# Patient Record
Sex: Female | Born: 1955
Health system: Southern US, Community
[De-identification: ages and names within clinical notes are randomized; demographics above are authoritative.]

## PROBLEM LIST (undated history)

## (undated) ENCOUNTER — Ambulatory Visit: Admission: EM | Payer: Medicare Other

## (undated) DIAGNOSIS — D582 Other hemoglobinopathies: Secondary | ICD-10-CM

## (undated) DIAGNOSIS — E785 Hyperlipidemia, unspecified: Secondary | ICD-10-CM

## (undated) DIAGNOSIS — D649 Anemia, unspecified: Secondary | ICD-10-CM

## (undated) HISTORY — DX: Anemia, unspecified: D64.9

## (undated) HISTORY — DX: Hyperlipidemia, unspecified: E78.5

## (undated) HISTORY — PX: WISDOM TOOTH EXTRACTION: SHX21

---

## 1998-02-14 ENCOUNTER — Other Ambulatory Visit: Admission: RE | Admit: 1998-02-14 | Discharge: 1998-02-14 | Payer: Self-pay | Admitting: Obstetrics and Gynecology

## 1999-01-16 ENCOUNTER — Other Ambulatory Visit: Admission: RE | Admit: 1999-01-16 | Discharge: 1999-01-16 | Payer: Self-pay | Admitting: Obstetrics and Gynecology

## 1999-02-01 ENCOUNTER — Ambulatory Visit (HOSPITAL_COMMUNITY): Admission: RE | Admit: 1999-02-01 | Discharge: 1999-02-01 | Payer: Self-pay | Admitting: Obstetrics and Gynecology

## 1999-02-01 ENCOUNTER — Encounter: Payer: Self-pay | Admitting: Obstetrics and Gynecology

## 2000-01-22 ENCOUNTER — Other Ambulatory Visit: Admission: RE | Admit: 2000-01-22 | Discharge: 2000-01-22 | Payer: Self-pay | Admitting: Obstetrics and Gynecology

## 2001-02-17 ENCOUNTER — Other Ambulatory Visit: Admission: RE | Admit: 2001-02-17 | Discharge: 2001-02-17 | Payer: Self-pay | Admitting: Obstetrics and Gynecology

## 2002-03-11 ENCOUNTER — Other Ambulatory Visit: Admission: RE | Admit: 2002-03-11 | Discharge: 2002-03-11 | Payer: Self-pay | Admitting: Obstetrics and Gynecology

## 2003-02-10 ENCOUNTER — Other Ambulatory Visit: Admission: RE | Admit: 2003-02-10 | Discharge: 2003-02-10 | Payer: Self-pay | Admitting: Obstetrics and Gynecology

## 2003-03-04 ENCOUNTER — Ambulatory Visit (HOSPITAL_COMMUNITY): Admission: RE | Admit: 2003-03-04 | Discharge: 2003-03-04 | Payer: Self-pay | Admitting: Obstetrics and Gynecology

## 2004-02-07 ENCOUNTER — Other Ambulatory Visit: Admission: RE | Admit: 2004-02-07 | Discharge: 2004-02-07 | Payer: Self-pay | Admitting: Obstetrics and Gynecology

## 2005-01-04 ENCOUNTER — Other Ambulatory Visit: Admission: RE | Admit: 2005-01-04 | Discharge: 2005-01-04 | Payer: Self-pay | Admitting: Obstetrics and Gynecology

## 2012-03-04 ENCOUNTER — Other Ambulatory Visit: Payer: Self-pay | Admitting: Family Medicine

## 2012-03-04 ENCOUNTER — Other Ambulatory Visit (HOSPITAL_COMMUNITY)
Admission: RE | Admit: 2012-03-04 | Discharge: 2012-03-04 | Disposition: A | Discharge status: Home / Self Care | Payer: BC Managed Care – PPO | Source: Ambulatory Visit | Attending: Family Medicine | Admitting: Family Medicine

## 2012-03-04 DIAGNOSIS — Z0000 Encounter for general adult medical examination without abnormal findings: Secondary | ICD-10-CM | POA: Insufficient documentation

## 2012-08-21 ENCOUNTER — Ambulatory Visit (INDEPENDENT_AMBULATORY_CARE_PROVIDER_SITE_OTHER): Payer: BC Managed Care – PPO | Admitting: Family Medicine

## 2012-08-21 VITALS — BP 129/76 | HR 80 | Temp 98.5°F | Resp 18 | Wt 202.0 lb

## 2012-08-21 DIAGNOSIS — K0889 Other specified disorders of teeth and supporting structures: Secondary | ICD-10-CM

## 2012-08-21 DIAGNOSIS — K089 Disorder of teeth and supporting structures, unspecified: Secondary | ICD-10-CM

## 2012-08-21 DIAGNOSIS — K029 Dental caries, unspecified: Secondary | ICD-10-CM

## 2012-08-21 MED ORDER — IBUPROFEN 600 MG PO TABS: 600.0000 mg | ORAL_TABLET | Freq: Three times a day (TID) | ORAL | Status: DC | PRN

## 2012-08-21 MED ORDER — AMOXICILLIN 500 MG PO CAPS: 500.0000 mg | ORAL_CAPSULE | Freq: Three times a day (TID) | ORAL | Status: DC

## 2012-08-21 MED ORDER — TRAMADOL HCL 50 MG PO TABS: 50.0000 mg | ORAL_TABLET | Freq: Four times a day (QID) | ORAL | Status: DC | PRN

## 2012-08-21 NOTE — Progress Notes (Signed)
Subjective:    Patient ID: Jeanette Bentley, female    DOB: July 31, 1955, 57 y.o.   MRN: [id number]  HPI Jeanette Bentley is a 57 y.o. female R upper tooth - sensitive to hot/cold past few months, then ache past week. Has stopped chewing on that side. Called dentist today- closed.  No fever, no face or apparent gum swelling.    Tx: anbesol, tylenol - min relief.  No recent narcotic pain meds prescribed, NKI allergies.   Review of Systems  Constitutional: Negative for fever and chills.  HENT: Positive for dental problem.        Objective:   Physical Exam  Vitals reviewed. Constitutional: She is oriented to person, place, and time. She appears well-developed and well-nourished. No distress.  HENT:  Head: Atraumatic. Macrocephalic.  Right Ear: Hearing, tympanic membrane, external ear and ear canal normal.  Left Ear: Hearing, tympanic membrane, external ear and ear canal normal.  Nose: Nose normal.  Mouth/Throat: Uvula is midline and oropharynx is clear and moist. Dental caries (? small area of central decay. no apparent mvmt. ) present. No dental abscesses. No oropharyngeal exudate.    Eyes: Conjunctivae and EOM are normal. Pupils are equal, round, and reactive to light.  Cardiovascular: Normal rate, regular rhythm, normal heart sounds and intact distal pulses.   No murmur heard. Pulmonary/Chest: Effort normal and breath sounds normal. No respiratory distress. She has no wheezes. She has no rhonchi.  Neurological: She is alert and oriented to person, place, and time.  Skin: Skin is warm and dry. No rash noted.  Psychiatric: She has a normal mood and affect. Her behavior is normal.       Assessment & Plan:  Jeanette Bentley is a 57 y.o. female Tooth pain - Plan: ibuprofen (ADVIL,MOTRIN) 600 MG tablet, traMADol (ULTRAM) 50 MG tablet  Dental caries - Plan: amoxicillin (AMOXIL) 500 MG capsule  R upper 2nd molar - likely decay without sign of abscess. Sx care and call  dentist on Monday.  Ibuprofen or if needed ultram Rx. Amoxicillin if any gum or face swelling. Rtc/er precautions.   Meds ordered this encounter  Medications  . rosuvastatin (CRESTOR) 10 MG tablet    Sig: Take 10 mg by mouth daily.  Marland Kitchen aspirin 81 MG tablet    Sig: Take 81 mg by mouth daily.  Marland Kitchen amoxicillin (AMOXIL) 500 MG capsule    Sig: Take 1 capsule (500 mg total) by mouth 3 (three) times daily.    Dispense:  30 capsule    Refill:  0  . ibuprofen (ADVIL,MOTRIN) 600 MG tablet    Sig: Take 1 tablet (600 mg total) by mouth every 8 (eight) hours as needed for pain.    Dispense:  30 tablet    Refill:  0  . traMADol (ULTRAM) 50 MG tablet    Sig: Take 1 tablet (50 mg total) by mouth every 6 (six) hours as needed for pain.    Dispense:  10 tablet    Refill:  0  .   Patient Instructions  Ibuprofen as prescribed with food. If needed for more sever pain - can take tramadol.  If any swelling around tooth or in face, start antibiotic. Call your dentist to be seen the first part of next week.  Return to the clinic or go to the nearest emergency room if any of your symptoms worsen or new symptoms occur. Dental Pain A tooth ache may be caused by cavities (tooth decay). Cavities expose the nerve of the  tooth to air and hot or cold temperatures. It may come from an infection or abscess (also called a boil or furuncle) around your tooth. It is also often caused by dental caries (tooth decay). This causes the pain you are having. DIAGNOSIS  Your caregiver can diagnose this problem by exam. TREATMENT   If caused by an infection, it may be treated with medications which kill germs (antibiotics) and pain medications as prescribed by your caregiver. Take medications as directed.  Only take over-the-counter or prescription medicines for pain, discomfort, or fever as directed by your caregiver.  Whether the tooth ache today is caused by infection or dental disease, you should see your dentist as soon as  possible for further care. SEEK MEDICAL CARE IF: The exam and treatment you received today has been provided on an emergency basis only. This is not a substitute for complete medical or dental care. If your problem worsens or new problems (symptoms) appear, and you are unable to meet with your dentist, call or return to this location. SEEK IMMEDIATE MEDICAL CARE IF:   You have a fever.  You develop redness and swelling of your face, jaw, or neck.  You are unable to open your mouth.  You have severe pain uncontrolled by pain medicine. MAKE SURE YOU:   Understand these instructions.  Will watch your condition.  Will get help right away if you are not doing well or get worse. Document Released: 04/15/2005 Document Revised: 07/08/2011 Document Reviewed: 12/02/2007 [facility] Patient Information 2013 [place], [place].

## 2012-08-21 NOTE — Patient Instructions (Signed)
Ibuprofen as prescribed with food. If needed for more sever pain - can take tramadol.  If any swelling around tooth or in face, start antibiotic. Call your dentist to be seen the first part of next week.  Return to the clinic or go to the nearest emergency room if any of your symptoms worsen or new symptoms occur. Dental Pain A tooth ache may be caused by cavities (tooth decay). Cavities expose the nerve of the tooth to air and hot or cold temperatures. It may come from an infection or abscess (also called a boil or furuncle) around your tooth. It is also often caused by dental caries (tooth decay). This causes the pain you are having. DIAGNOSIS  Your caregiver can diagnose this problem by exam. TREATMENT   If caused by an infection, it may be treated with medications which kill germs (antibiotics) and pain medications as prescribed by your caregiver. Take medications as directed.  Only take over-the-counter or prescription medicines for pain, discomfort, or fever as directed by your caregiver.  Whether the tooth ache today is caused by infection or dental disease, you should see your dentist as soon as possible for further care. SEEK MEDICAL CARE IF: The exam and treatment you received today has been provided on an emergency basis only. This is not a substitute for complete medical or dental care. If your problem worsens or new problems (symptoms) appear, and you are unable to meet with your dentist, call or return to this location. SEEK IMMEDIATE MEDICAL CARE IF:   You have a fever.  You develop redness and swelling of your face, jaw, or neck.  You are unable to open your mouth.  You have severe pain uncontrolled by pain medicine. MAKE SURE YOU:   Understand these instructions.  Will watch your condition.  Will get help right away if you are not doing well or get worse. Document Released: 04/15/2005 Document Revised: 07/08/2011 Document Reviewed: 12/02/2007 [facility] [place] Patient  Information 2013 [place], [place].

## 2012-09-15 ENCOUNTER — Ambulatory Visit: Payer: BC Managed Care – PPO

## 2012-09-15 ENCOUNTER — Ambulatory Visit (INDEPENDENT_AMBULATORY_CARE_PROVIDER_SITE_OTHER): Payer: BC Managed Care – PPO | Admitting: Family Medicine

## 2012-09-15 VITALS — BP 122/74 | HR 68 | Temp 98.3°F | Resp 16 | Ht 64.5 in | Wt 201.0 lb

## 2012-09-15 DIAGNOSIS — M542 Cervicalgia: Secondary | ICD-10-CM

## 2012-09-15 MED ORDER — MELOXICAM 15 MG PO TABS: 15.0000 mg | ORAL_TABLET | Freq: Every day | ORAL | Status: DC | PRN

## 2012-09-15 MED ORDER — CYCLOBENZAPRINE HCL 10 MG PO TABS: 10.0000 mg | ORAL_TABLET | Freq: Three times a day (TID) | ORAL | Status: DC | PRN

## 2012-09-15 NOTE — Progress Notes (Addendum)
Jeanette Bentley is a 57 y.o. female who presents to [facility] today for left shoulder and neck pain for the last week without injury. Patient notes pain in her left trapezius and left thoracic back. She denies any pain that is worse with activity. She denies any pain radiating to her hand weakness or numbness. She feels well otherwise. She notes that she has been sleeping poorly recently and wonders if this is the cause of her pain. She works mostly a Office manager.  She has not tried any medications yet and feels well otherwise. She denies any similar episodes of neck or shoulder pain.  No pain with overhand motion.   PMH: Reviewed hyperlipidemia History  Substance Use Topics  . Smoking status: Never Smoker   . Smokeless tobacco: Not on file  . Alcohol Use: Yes   ROS as above  Medications reviewed. Current Outpatient Prescriptions  Medication Sig Dispense Refill  . aspirin 81 MG tablet Take 81 mg by mouth daily.      . rosuvastatin (CRESTOR) 10 MG tablet Take 10 mg by mouth daily.      Marland Kitchen amoxicillin (AMOXIL) 500 MG capsule Take 1 capsule (500 mg total) by mouth 3 (three) times daily.  30 capsule  0  . ibuprofen (ADVIL,MOTRIN) 600 MG tablet Take 1 tablet (600 mg total) by mouth every 8 (eight) hours as needed for pain.  30 tablet  0  . traMADol (ULTRAM) 50 MG tablet Take 1 tablet (50 mg total) by mouth every 6 (six) hours as needed for pain.  10 tablet  0   No current facility-administered medications for this visit.     Exam:  BP 122/74  Pulse 68  Temp(Src) 98.3 F (36.8 C) (Oral)  Resp 16  Ht 5' 4.5" (1.638 m)  Wt 201 lb (91.173 kg)  BMI 33.98 kg/m2  SpO2 96% Gen: Well NAD NECK: Nontender spinal midline. Tender left cervical and thoracic paraspinal muscles. Decreased range of motion with pain with left lateral flexion and rotation. Mildly positive Spurling test.  Left shoulder: Normal-appearing nontender. Normal range of motion. Negative impingement testing. Strength testing normal  throughout.   2v c-spine prelim results. Spur and DDD C6-7  Assessment and Plan: 57 y.o. female with neck muscle spasm with mild DDD. Plan to treat with meloxicam, tramadol, Flexeril. Additionally use heat. Consider physical therapy if not improving. Discussed warning signs or symptoms. Please see discharge instructions. Patient expresses understanding.  I have reviewed and agree with documentation. Robert P. Merla Riches, M.D.

## 2012-09-15 NOTE — Patient Instructions (Addendum)
Thank you for coming in today. YOu have a neck muscle spasm as the cause of your pain.  Use the meloxicam as needed for pain.  Also try flexeril and tramadol.  Call for physical therapy in 1 week if not improving.  Come back or go to the emergency room if you notice new weakness new numbness problems walking or bowel or bladder problems.  Cervical Sprain A cervical sprain is an injury in the neck in which the ligaments are stretched or torn. The ligaments are the tissues that hold the bones of the neck (vertebrae) in place.Cervical sprains can range from very mild to very severe. Most cervical sprains get better in 1 to 3 weeks, but it depends on the cause and extent of the injury. Severe cervical sprains can cause the neck vertebrae to be unstable. This can lead to damage of the spinal cord and can result in serious nervous system problems. Your caregiver will determine whether your cervical sprain is mild or severe. CAUSES  Severe cervical sprains may be caused by:  Contact sport injuries (football, rugby, wrestling, hockey, auto racing, gymnastics, diving, martial arts, boxing).  Motor vehicle collisions.  Whiplash injuries. This means the neck is forcefully whipped backward and forward.  Falls. Mild cervical sprains may be caused by:   Awkward positions, such as cradling a telephone between your ear and shoulder.  Sitting in a chair that does not offer proper support.  Working at a poorly Marketing executive station.  Activities that require looking up or down for long periods of time. SYMPTOMS   Pain, soreness, stiffness, or a burning sensation in the front, back, or sides of the neck. This discomfort may develop immediately after injury or it may develop slowly and not begin for 24 hours or more after an injury.  Pain or tenderness directly in the middle of the back of the neck.  Shoulder or upper back pain.  Limited ability to move the  neck.  Headache.  Dizziness.  Weakness, numbness, or tingling in the hands or arms.  Muscle spasms.  Difficulty swallowing or chewing.  Tenderness and swelling of the neck. DIAGNOSIS  Most of the time, your caregiver can diagnose this problem by taking your history and doing a physical exam. Your caregiver will ask about any known problems, such as arthritis in the neck or a previous neck injury. X-rays may be taken to find out if there are any other problems, such as problems with the bones of the neck. However, an X-ray often does not reveal the full extent of a cervical sprain. Other tests such as a computed tomography (CT) scan or magnetic resonance imaging (MRI) may be needed. TREATMENT  Treatment depends on the severity of the cervical sprain. Mild sprains can be treated with rest, keeping the neck in place (immobilization), and pain medicines. Severe cervical sprains need immediate immobilization and an appointment with an orthopedist or neurosurgeon. Several treatment options are available to help with pain, muscle spasms, and other symptoms. Your caregiver may prescribe:  Medicines, such as pain relievers, numbing medicines, or muscle relaxants.  Physical therapy. This can include stretching exercises, strengthening exercises, and posture training. Exercises and improved posture can help stabilize the neck, strengthen muscles, and help stop symptoms from returning.  A neck collar to be worn for short periods of time. Often, these collars are worn for comfort. However, certain collars may be worn to protect the neck and prevent further worsening of a serious cervical sprain. HOME CARE INSTRUCTIONS  Put ice on the injured area.  Put ice in a plastic bag.  Place a towel between your skin and the bag.  Leave the ice on for 15 to 20 minutes, 3 to 4 times a day.  Only take over-the-counter or prescription medicines for pain, discomfort, or fever as directed by your  caregiver.  Keep all follow-up appointments as directed by your caregiver.  Keep all physical therapy appointments as directed by your caregiver.  If a neck collar is prescribed, wear it as directed by your caregiver.  Do not drive while wearing a neck collar.  Make any needed adjustments to your work station to promote good posture.  Avoid positions and activities that make your symptoms worse.  Warm up and stretch before being active to help prevent problems. SEEK MEDICAL CARE IF:   Your pain is not controlled with medicine.  You are unable to decrease your pain medicine over time as planned.  Your activity level is not improving as expected. SEEK IMMEDIATE MEDICAL CARE IF:   You develop any bleeding, stomach upset, or signs of an allergic reaction to your medicine.  Your symptoms get worse.  You develop new, unexplained symptoms.  You have numbness, tingling, weakness, or paralysis in any part of your body. MAKE SURE YOU:   Understand these instructions.  Will watch your condition.  Will get help right away if you are not doing well or get worse. Document Released: 02/10/2007 Document Revised: 07/08/2011 Document Reviewed: 01/16/2011 [facility] Patient Information 2013 [place], [place].

## 2012-12-08 ENCOUNTER — Other Ambulatory Visit (HOSPITAL_COMMUNITY): Payer: Self-pay | Admitting: Family Medicine

## 2012-12-08 DIAGNOSIS — Z1231 Encounter for screening mammogram for malignant neoplasm of breast: Secondary | ICD-10-CM

## 2012-12-09 ENCOUNTER — Ambulatory Visit (HOSPITAL_COMMUNITY)
Admission: RE | Admit: 2012-12-09 | Discharge: 2012-12-09 | Disposition: A | Discharge status: Home / Self Care | Payer: BC Managed Care – PPO | Source: Ambulatory Visit | Attending: Family Medicine | Admitting: Family Medicine

## 2012-12-09 DIAGNOSIS — Z1231 Encounter for screening mammogram for malignant neoplasm of breast: Secondary | ICD-10-CM | POA: Insufficient documentation

## 2013-10-13 ENCOUNTER — Ambulatory Visit (INDEPENDENT_AMBULATORY_CARE_PROVIDER_SITE_OTHER): Payer: BC Managed Care – PPO | Admitting: Family Medicine

## 2013-10-13 VITALS — BP 110/68 | HR 63 | Temp 98.2°F | Resp 16 | Ht 65.0 in | Wt 191.4 lb

## 2013-10-13 DIAGNOSIS — K319 Disease of stomach and duodenum, unspecified: Secondary | ICD-10-CM

## 2013-10-13 DIAGNOSIS — L723 Sebaceous cyst: Secondary | ICD-10-CM

## 2013-10-13 MED ORDER — DOXYCYCLINE HYCLATE 100 MG PO TABS: 100.0000 mg | ORAL_TABLET | Freq: Two times a day (BID) | ORAL | Status: DC

## 2013-10-13 NOTE — Progress Notes (Signed)
Chief Complaint:  Chief Complaint  Patient presents with  . Mass    small bump on lower L side, appeared a month ago    HPI: Jeanette Bentley is a 58 y.o. female who is here for  1 month history of irritated skin lesion on left side of her lower abdomen, 3-4 days ago started drainining she has been touchign it as well. . Deneis fevers or chills.  Recently She used a girdle to help with weight loss, she put a cream on  Her skin first then used the girdle, she was using this for about 1 month and then she noticed the lesion, there has been a smelly discharge. She has no prior hx of sebaceous cysts. She has not had any fevers or chills, no MRSA.     Past Medical History  Diagnosis Date  . Hyperlipidemia   . Anemia    Past Surgical History  Procedure Laterality Date  . Wisdom tooth extraction    . Cesarean section     History   Social History  . Marital Status: Married    Spouse Name: N/A    Number of Children: N/A  . Years of Education: N/A   Social History Main Topics  . Smoking status: Never Smoker   . Smokeless tobacco: None  . Alcohol Use: Yes  . Drug Use: No  . Sexual Activity: Yes    Birth Control/ Protection: None   Other Topics Concern  . None   Social History Narrative  . None   Family History  Problem Relation Age of Onset  . Arthritis Mother   . Heart disease Father   . Hypertension Father   . Kidney disease Sister   . Hypertension Brother   . Asthma Mother   . Ulcers Mother   . Diabetes Sister   . Anxiety disorder Daughter    No Known Allergies Prior to Admission medications   Medication Sig Start Date End Date Taking? Authorizing Provider  rosuvastatin (CRESTOR) 10 MG tablet Take 10 mg by mouth daily.   Yes Historical Provider, MD     ROS: The patient denies fevers, chills, night sweats, unintentional weight loss, chest pain, palpitations, wheezing, dyspnea on exertion, nausea, vomiting, abdominal pain, dysuria, hematuria, melena,  numbness, weakness, or tingling.  All other systems have been reviewed and were otherwise negative with the exception of those mentioned in the HPI and as above.    PHYSICAL EXAM: Filed Vitals:   10/13/13 1433  BP: 110/68  Pulse: 63  Temp: 98.2 F (36.8 C)  Resp: 16   Filed Vitals:   10/13/13 1433  Height: 5\' 5"  (1.651 m)  Weight: 191 lb 6.4 oz (86.818 kg)   Body mass index is 31.85 kg/(m^2).  General: Alert, no acute distress HEENT:  Normocephalic, atraumatic, oropharynx patent. EOMI, PERRLA Cardiovascular:  Regular rate and rhythm, no rubs murmurs or gallops.  No Carotid bruits, radial pulse intact. No pedal edema.  Respiratory: Clear to auscultation bilaterally.  No wheezes, rales, or rhonchi.  No cyanosis, no use of accessory musculature GI: No organomegaly, abdomen is soft and non-tender, positive bowel sounds.  No masses. Skin: + draining sebaceous cyst , odorous fatty tissu dc, I was able to get all dc out  nad there is no fluctunace underneath that would require I and D. At this time.  Neurologic: Facial musculature symmetric. Psychiatric: Patient is appropriate throughout our interaction. Lymphatic: No cervical lymphadenopathy Musculoskeletal: Gait intact.   LABS: No  results found for this or any previous visit.   EKG/XRAY:   Primary [facility]   ASSESSMENT/PLAN: Encounter Diagnoses  Name Primary?  . Inflamed sebaceous cyst Yes  . Lesion of stomach    There is an inflammed  sebaceous cyst that is draining over the lower left abdomen, it is on of what may be a hyperpigmented area due to excoriation or mole Will conservatively treat since self draining and has colon underneath it Ask to do warm compresses TID-QID Rx Doxycycline if worsening sxs When more fluctuant or more bothersome then will consider procedure, wound cx pending F/u prn   Gross sideeffects, risk and benefits, and alternatives of medications d/w patient. Patient is  aware that all medications have potential sideeffects and we are unable to predict every sideeffect or drug-drug interaction that may occur.  Hamilton CapriLE, THAO PHUONG, DO 10/13/2013 4:07 PM

## 2013-10-13 NOTE — Patient Instructions (Signed)
Epidermal Cyst An epidermal cyst is usually a small, painless lump under the skin. Cysts often occur on the face, neck, stomach, chest, or genitals. The cyst may be filled with a bad smelling paste. Do not pop your cyst. Popping the cyst can cause pain and puffiness (swelling). HOME CARE   Only take medicines as told by your doctor.  Take your medicine (antibiotics) as told. Finish it even if you start to feel better. GET HELP RIGHT AWAY IF:  Your cyst is tender, red, or puffy.  You are not getting better, or you are getting worse.  You have any questions or concerns. MAKE SURE YOU:  Understand these instructions.  Will watch your condition.  Will get help right away if you are not doing well or get worse. Document Released: 05/23/2004 Document Revised: 10/15/2011 Document Reviewed: 10/22/2010 [employer] Patient Information 2015 [employer]. This information is not intended to replace advice given to you by your health care provider. Make sure you discuss any questions you have with your health care provider.  

## 2013-10-16 LAB — WOUND CULTURE
Gram Stain: NONE SEEN
Gram Stain: NONE SEEN

## 2014-01-21 ENCOUNTER — Other Ambulatory Visit (HOSPITAL_COMMUNITY): Payer: Self-pay | Admitting: Family Medicine

## 2014-01-21 DIAGNOSIS — Z1231 Encounter for screening mammogram for malignant neoplasm of breast: Secondary | ICD-10-CM

## 2014-01-25 ENCOUNTER — Ambulatory Visit (HOSPITAL_COMMUNITY)
Admission: RE | Admit: 2014-01-25 | Discharge: 2014-01-25 | Disposition: A | Discharge status: Home / Self Care | Payer: BC Managed Care – PPO | Source: Ambulatory Visit | Attending: Family Medicine | Admitting: Family Medicine

## 2014-01-25 DIAGNOSIS — Z1231 Encounter for screening mammogram for malignant neoplasm of breast: Secondary | ICD-10-CM | POA: Diagnosis present

## 2014-10-12 ENCOUNTER — Other Ambulatory Visit (HOSPITAL_COMMUNITY)
Admission: RE | Admit: 2014-10-12 | Discharge: 2014-10-12 | Disposition: A | Discharge status: Home / Self Care | Payer: BLUE CROSS/BLUE SHIELD | Source: Ambulatory Visit | Attending: Family Medicine | Admitting: Family Medicine

## 2014-10-12 ENCOUNTER — Other Ambulatory Visit: Payer: Self-pay | Admitting: Family Medicine

## 2014-10-12 DIAGNOSIS — Z01419 Encounter for gynecological examination (general) (routine) without abnormal findings: Secondary | ICD-10-CM | POA: Insufficient documentation

## 2014-10-13 LAB — CYTOLOGY - PAP

## 2015-03-10 ENCOUNTER — Other Ambulatory Visit: Payer: Self-pay

## 2015-03-10 DIAGNOSIS — Z1231 Encounter for screening mammogram for malignant neoplasm of breast: Secondary | ICD-10-CM

## 2015-03-30 ENCOUNTER — Ambulatory Visit
Admission: RE | Admit: 2015-03-30 | Discharge: 2015-03-30 | Disposition: A | Discharge status: Home / Self Care | Payer: BLUE CROSS/BLUE SHIELD | Source: Ambulatory Visit

## 2015-03-30 DIAGNOSIS — Z1231 Encounter for screening mammogram for malignant neoplasm of breast: Secondary | ICD-10-CM

## 2015-10-18 DIAGNOSIS — Z23 Encounter for immunization: Secondary | ICD-10-CM | POA: Diagnosis not present

## 2015-10-18 DIAGNOSIS — E7800 Pure hypercholesterolemia, unspecified: Secondary | ICD-10-CM | POA: Diagnosis not present

## 2015-10-18 DIAGNOSIS — Z0000 Encounter for general adult medical examination without abnormal findings: Secondary | ICD-10-CM | POA: Diagnosis not present

## 2016-03-08 ENCOUNTER — Other Ambulatory Visit: Payer: Self-pay | Admitting: Family Medicine

## 2016-03-08 DIAGNOSIS — Z1231 Encounter for screening mammogram for malignant neoplasm of breast: Secondary | ICD-10-CM

## 2016-04-02 ENCOUNTER — Ambulatory Visit
Admission: RE | Admit: 2016-04-02 | Discharge: 2016-04-02 | Disposition: A | Discharge status: Home / Self Care | Payer: BLUE CROSS/BLUE SHIELD | Source: Ambulatory Visit | Attending: Family Medicine | Admitting: Family Medicine

## 2016-04-02 DIAGNOSIS — Z1231 Encounter for screening mammogram for malignant neoplasm of breast: Secondary | ICD-10-CM

## 2016-10-16 DIAGNOSIS — E7800 Pure hypercholesterolemia, unspecified: Secondary | ICD-10-CM | POA: Diagnosis not present

## 2016-10-16 DIAGNOSIS — E669 Obesity, unspecified: Secondary | ICD-10-CM | POA: Diagnosis not present

## 2016-10-16 DIAGNOSIS — R609 Edema, unspecified: Secondary | ICD-10-CM | POA: Diagnosis not present

## 2016-12-10 DIAGNOSIS — E7800 Pure hypercholesterolemia, unspecified: Secondary | ICD-10-CM | POA: Diagnosis not present

## 2016-12-10 DIAGNOSIS — Z0000 Encounter for general adult medical examination without abnormal findings: Secondary | ICD-10-CM | POA: Diagnosis not present

## 2016-12-10 DIAGNOSIS — E669 Obesity, unspecified: Secondary | ICD-10-CM | POA: Diagnosis not present

## 2016-12-10 DIAGNOSIS — H6123 Impacted cerumen, bilateral: Secondary | ICD-10-CM | POA: Diagnosis not present

## 2016-12-10 DIAGNOSIS — M549 Dorsalgia, unspecified: Secondary | ICD-10-CM | POA: Diagnosis not present

## 2017-03-25 ENCOUNTER — Other Ambulatory Visit: Payer: Self-pay | Admitting: Family Medicine

## 2017-03-25 DIAGNOSIS — Z1231 Encounter for screening mammogram for malignant neoplasm of breast: Secondary | ICD-10-CM

## 2017-04-28 ENCOUNTER — Ambulatory Visit: Payer: Self-pay | Admitting: Family Medicine

## 2017-04-28 DIAGNOSIS — L309 Dermatitis, unspecified: Secondary | ICD-10-CM | POA: Diagnosis not present

## 2017-05-06 ENCOUNTER — Ambulatory Visit
Admission: RE | Admit: 2017-05-06 | Discharge: 2017-05-06 | Disposition: A | Discharge status: Home / Self Care | Payer: BLUE CROSS/BLUE SHIELD | Source: Ambulatory Visit | Attending: Family Medicine | Admitting: Family Medicine

## 2017-05-06 DIAGNOSIS — Z1231 Encounter for screening mammogram for malignant neoplasm of breast: Secondary | ICD-10-CM | POA: Diagnosis not present

## 2017-06-10 DIAGNOSIS — E7800 Pure hypercholesterolemia, unspecified: Secondary | ICD-10-CM | POA: Diagnosis not present

## 2017-06-10 DIAGNOSIS — E669 Obesity, unspecified: Secondary | ICD-10-CM | POA: Diagnosis not present

## 2017-09-22 ENCOUNTER — Encounter: Payer: Self-pay | Admitting: Family Medicine

## 2017-12-16 DIAGNOSIS — Z0000 Encounter for general adult medical examination without abnormal findings: Secondary | ICD-10-CM | POA: Diagnosis not present

## 2017-12-16 DIAGNOSIS — E7800 Pure hypercholesterolemia, unspecified: Secondary | ICD-10-CM | POA: Diagnosis not present

## 2018-05-06 ENCOUNTER — Other Ambulatory Visit: Payer: Self-pay | Admitting: Family Medicine

## 2018-05-06 DIAGNOSIS — Z1231 Encounter for screening mammogram for malignant neoplasm of breast: Secondary | ICD-10-CM

## 2018-05-27 ENCOUNTER — Ambulatory Visit
Admission: RE | Admit: 2018-05-27 | Discharge: 2018-05-27 | Disposition: A | Discharge status: Home / Self Care | Payer: BLUE CROSS/BLUE SHIELD | Source: Ambulatory Visit | Attending: Family Medicine | Admitting: Family Medicine

## 2018-05-27 DIAGNOSIS — Z1231 Encounter for screening mammogram for malignant neoplasm of breast: Secondary | ICD-10-CM | POA: Diagnosis not present

## 2018-08-20 DIAGNOSIS — L2089 Other atopic dermatitis: Secondary | ICD-10-CM | POA: Diagnosis not present

## 2018-12-24 ENCOUNTER — Other Ambulatory Visit (HOSPITAL_COMMUNITY)
Admission: RE | Admit: 2018-12-24 | Discharge: 2018-12-24 | Disposition: A | Discharge status: Home / Self Care | Payer: BC Managed Care – PPO | Source: Ambulatory Visit | Attending: Family Medicine | Admitting: Family Medicine

## 2018-12-24 ENCOUNTER — Other Ambulatory Visit: Payer: Self-pay | Admitting: Family Medicine

## 2018-12-24 DIAGNOSIS — Z6832 Body mass index (BMI) 32.0-32.9, adult: Secondary | ICD-10-CM | POA: Diagnosis not present

## 2018-12-24 DIAGNOSIS — Z833 Family history of diabetes mellitus: Secondary | ICD-10-CM | POA: Diagnosis not present

## 2018-12-24 DIAGNOSIS — Z0000 Encounter for general adult medical examination without abnormal findings: Secondary | ICD-10-CM | POA: Diagnosis not present

## 2018-12-24 DIAGNOSIS — Z124 Encounter for screening for malignant neoplasm of cervix: Secondary | ICD-10-CM | POA: Diagnosis not present

## 2018-12-24 DIAGNOSIS — E7800 Pure hypercholesterolemia, unspecified: Secondary | ICD-10-CM | POA: Diagnosis not present

## 2018-12-25 LAB — CYTOLOGY - PAP
Diagnosis: NEGATIVE
HPV: NOT DETECTED

## 2019-05-10 ENCOUNTER — Other Ambulatory Visit: Payer: Self-pay | Admitting: Family Medicine

## 2019-05-10 DIAGNOSIS — Z1231 Encounter for screening mammogram for malignant neoplasm of breast: Secondary | ICD-10-CM

## 2019-06-09 ENCOUNTER — Ambulatory Visit
Admission: RE | Admit: 2019-06-09 | Discharge: 2019-06-09 | Disposition: A | Discharge status: Home / Self Care | Payer: BC Managed Care – PPO | Source: Ambulatory Visit | Attending: Family Medicine | Admitting: Family Medicine

## 2019-06-09 ENCOUNTER — Other Ambulatory Visit: Payer: Self-pay

## 2019-06-09 DIAGNOSIS — Z1231 Encounter for screening mammogram for malignant neoplasm of breast: Secondary | ICD-10-CM | POA: Diagnosis not present

## 2019-07-05 ENCOUNTER — Ambulatory Visit: Payer: BC Managed Care – PPO

## 2019-12-17 DIAGNOSIS — L720 Epidermal cyst: Secondary | ICD-10-CM | POA: Diagnosis not present

## 2019-12-17 DIAGNOSIS — L810 Postinflammatory hyperpigmentation: Secondary | ICD-10-CM | POA: Diagnosis not present

## 2019-12-17 DIAGNOSIS — I788 Other diseases of capillaries: Secondary | ICD-10-CM | POA: Diagnosis not present

## 2020-01-11 DIAGNOSIS — Z62820 Parent-biological child conflict: Secondary | ICD-10-CM | POA: Diagnosis not present

## 2020-01-20 DIAGNOSIS — Z23 Encounter for immunization: Secondary | ICD-10-CM | POA: Diagnosis not present

## 2020-01-20 DIAGNOSIS — E7800 Pure hypercholesterolemia, unspecified: Secondary | ICD-10-CM | POA: Diagnosis not present

## 2020-01-20 DIAGNOSIS — Z833 Family history of diabetes mellitus: Secondary | ICD-10-CM | POA: Diagnosis not present

## 2020-01-20 DIAGNOSIS — Z0000 Encounter for general adult medical examination without abnormal findings: Secondary | ICD-10-CM | POA: Diagnosis not present

## 2020-02-15 DIAGNOSIS — F4323 Adjustment disorder with mixed anxiety and depressed mood: Secondary | ICD-10-CM | POA: Diagnosis not present

## 2020-02-16 DIAGNOSIS — F4323 Adjustment disorder with mixed anxiety and depressed mood: Secondary | ICD-10-CM | POA: Diagnosis not present

## 2020-02-17 DIAGNOSIS — F4323 Adjustment disorder with mixed anxiety and depressed mood: Secondary | ICD-10-CM | POA: Diagnosis not present

## 2020-02-25 DIAGNOSIS — F4323 Adjustment disorder with mixed anxiety and depressed mood: Secondary | ICD-10-CM | POA: Diagnosis not present

## 2020-03-02 DIAGNOSIS — F4323 Adjustment disorder with mixed anxiety and depressed mood: Secondary | ICD-10-CM | POA: Diagnosis not present

## 2020-03-09 DIAGNOSIS — F4323 Adjustment disorder with mixed anxiety and depressed mood: Secondary | ICD-10-CM | POA: Diagnosis not present

## 2020-03-14 DIAGNOSIS — F4323 Adjustment disorder with mixed anxiety and depressed mood: Secondary | ICD-10-CM | POA: Diagnosis not present

## 2020-03-16 DIAGNOSIS — F4323 Adjustment disorder with mixed anxiety and depressed mood: Secondary | ICD-10-CM | POA: Diagnosis not present

## 2020-03-21 DIAGNOSIS — F4323 Adjustment disorder with mixed anxiety and depressed mood: Secondary | ICD-10-CM | POA: Diagnosis not present

## 2020-03-28 DIAGNOSIS — F4323 Adjustment disorder with mixed anxiety and depressed mood: Secondary | ICD-10-CM | POA: Diagnosis not present

## 2020-04-06 DIAGNOSIS — F4323 Adjustment disorder with mixed anxiety and depressed mood: Secondary | ICD-10-CM | POA: Diagnosis not present

## 2020-04-11 DIAGNOSIS — F4323 Adjustment disorder with mixed anxiety and depressed mood: Secondary | ICD-10-CM | POA: Diagnosis not present

## 2020-04-13 DIAGNOSIS — F4323 Adjustment disorder with mixed anxiety and depressed mood: Secondary | ICD-10-CM | POA: Diagnosis not present

## 2020-04-18 DIAGNOSIS — F4323 Adjustment disorder with mixed anxiety and depressed mood: Secondary | ICD-10-CM | POA: Diagnosis not present

## 2020-04-25 DIAGNOSIS — F4323 Adjustment disorder with mixed anxiety and depressed mood: Secondary | ICD-10-CM | POA: Diagnosis not present

## 2020-04-27 DIAGNOSIS — Z23 Encounter for immunization: Secondary | ICD-10-CM | POA: Diagnosis not present

## 2020-05-29 ENCOUNTER — Other Ambulatory Visit: Payer: Self-pay | Admitting: Family Medicine

## 2020-05-29 DIAGNOSIS — Z1231 Encounter for screening mammogram for malignant neoplasm of breast: Secondary | ICD-10-CM

## 2020-07-11 ENCOUNTER — Other Ambulatory Visit: Payer: Self-pay

## 2020-07-11 ENCOUNTER — Ambulatory Visit
Admission: RE | Admit: 2020-07-11 | Discharge: 2020-07-11 | Disposition: A | Discharge status: Home / Self Care | Payer: BC Managed Care – PPO | Source: Ambulatory Visit | Attending: Family Medicine | Admitting: Family Medicine

## 2020-07-11 DIAGNOSIS — Z1231 Encounter for screening mammogram for malignant neoplasm of breast: Secondary | ICD-10-CM | POA: Diagnosis not present

## 2020-10-10 DIAGNOSIS — F4323 Adjustment disorder with mixed anxiety and depressed mood: Secondary | ICD-10-CM | POA: Diagnosis not present

## 2020-10-17 DIAGNOSIS — F4323 Adjustment disorder with mixed anxiety and depressed mood: Secondary | ICD-10-CM | POA: Diagnosis not present

## 2020-10-24 DIAGNOSIS — F4323 Adjustment disorder with mixed anxiety and depressed mood: Secondary | ICD-10-CM | POA: Diagnosis not present

## 2020-11-07 DIAGNOSIS — F4323 Adjustment disorder with mixed anxiety and depressed mood: Secondary | ICD-10-CM | POA: Diagnosis not present

## 2020-11-14 DIAGNOSIS — F4323 Adjustment disorder with mixed anxiety and depressed mood: Secondary | ICD-10-CM | POA: Diagnosis not present

## 2020-11-25 DIAGNOSIS — F4323 Adjustment disorder with mixed anxiety and depressed mood: Secondary | ICD-10-CM | POA: Diagnosis not present

## 2020-11-29 DIAGNOSIS — F4323 Adjustment disorder with mixed anxiety and depressed mood: Secondary | ICD-10-CM | POA: Diagnosis not present

## 2020-12-12 DIAGNOSIS — F4323 Adjustment disorder with mixed anxiety and depressed mood: Secondary | ICD-10-CM | POA: Diagnosis not present

## 2020-12-26 DIAGNOSIS — F4323 Adjustment disorder with mixed anxiety and depressed mood: Secondary | ICD-10-CM | POA: Diagnosis not present

## 2021-01-05 DIAGNOSIS — F4323 Adjustment disorder with mixed anxiety and depressed mood: Secondary | ICD-10-CM | POA: Diagnosis not present

## 2021-01-10 ENCOUNTER — Other Ambulatory Visit: Payer: Self-pay

## 2021-01-10 ENCOUNTER — Ambulatory Visit
Admission: EM | Admit: 2021-01-10 | Discharge: 2021-01-10 | Disposition: A | Discharge status: Home / Self Care | Payer: BC Managed Care – PPO

## 2021-01-10 DIAGNOSIS — H6123 Impacted cerumen, bilateral: Secondary | ICD-10-CM | POA: Diagnosis not present

## 2021-01-10 NOTE — ED Triage Notes (Signed)
Pt states she is having difficulty hearing from both ears started years ago per patient. She states she has been using Debrox with no relief.

## 2021-01-10 NOTE — ED Provider Notes (Addendum)
[facility]    CSN: [id number] Arrival date & time: 01/10/21  1319      History   Chief Complaint Chief Complaint  Patient presents with   Hearing Problem    HPI Jeanette Bentley is a 65 y.o. female.   Patient presenting today with months to years of muffled hearing, ear fullness and wax buildup.  She started using Debrox drops and an over-the-counter rinsing kit 2 days ago with no relief.  She does use Q-tips regularly to clean out her ears.  Denies headache, drainage from ears, fevers, chills, ear pain.   Past Medical History:  Diagnosis Date   Anemia    Hyperlipidemia     Patient Active Problem List   Diagnosis Date Noted   Neck pain 09/15/2012    Past Surgical History:  Procedure Laterality Date   CESAREAN SECTION     WISDOM TOOTH EXTRACTION      OB History   No obstetric history on file.      Home Medications    Prior to Admission medications   Medication Sig Start Date End Date Taking? Authorizing Provider  Cholecalciferol (VITAMIN D-3) 125 MCG (5000 UT) TABS 1 tablet    [provider]  doxycycline (VIBRA-TABS) 100 MG tablet Take 1 tablet (100 mg total) by mouth 2 (two) times daily. 10/13/13   Le, Thao P, DO  Krill Oil 1000 MG CAPS 2 capsules    [provider]  Red Yeast Rice 600 MG CAPS 4 capsules    [provider]  rosuvastatin (CRESTOR) 10 MG tablet Take 10 mg by mouth daily.    [provider]    Family History Family History  Problem Relation Age of Onset   Arthritis Mother    Asthma Mother    Ulcers Mother    Heart disease Father    Hypertension Father    Kidney disease Sister    Hypertension Brother    Diabetes Sister    Anxiety disorder Daughter     Social History Social History   Tobacco Use   Smoking status: Never  Substance Use Topics   Alcohol use: Yes   Drug use: No     Allergies   Patient has no known allergies.   Review of Systems Review of Systems Per  HPI  Physical Exam Triage Vital Signs ED Triage Vitals  Enc Vitals Group     BP 01/10/21 1347 124/75     Pulse Rate 01/10/21 1347 61     Resp 01/10/21 1347 18     Temp 01/10/21 1347 97.8 F (36.6 C)     Temp Source 01/10/21 1347 Oral     SpO2 01/10/21 1347 97 %     Weight --      Height --      Head Circumference --      Peak Flow --      Pain Score 01/10/21 1345 0     Pain Loc --      Pain Edu? --      Excl. in [place]? --    No data found.  Updated Vital Signs BP 124/75 (BP Location: Right Arm)   Pulse 61   Temp 97.8 F (36.6 C) (Oral)   Resp 18   SpO2 97%   Visual Acuity Right Eye Distance:   Left Eye Distance:   Bilateral Distance:    Right Eye Near:   Left Eye Near:    Bilateral Near:     Physical  Exam Vitals and nursing note reviewed.  Constitutional:      Appearance: Normal appearance. She is not ill-appearing.  HENT:     Head: Atraumatic.     Right Ear: There is impacted cerumen.     Left Ear: There is impacted cerumen.     Nose: Nose normal.     Mouth/Throat:     Mouth: Mucous membranes are moist.  Eyes:     Extraocular Movements: Extraocular movements intact.     Conjunctiva/sclera: Conjunctivae normal.  Cardiovascular:     Rate and Rhythm: Normal rate and regular rhythm.     Heart sounds: Normal heart sounds.  Pulmonary:     Effort: Pulmonary effort is normal.     Breath sounds: Normal breath sounds. No wheezing or rales.  Musculoskeletal:        General: Normal range of motion.     Cervical back: Normal range of motion and neck supple.  Skin:    General: Skin is warm and dry.  Neurological:     Mental Status: She is alert and oriented to person, place, and time.  Psychiatric:        Mood and Affect: Mood normal.        Thought Content: Thought content normal.        Judgment: Judgment normal.     UC Treatments / Results  Labs (all labs ordered are listed, but only abnormal results are displayed) Labs Reviewed - No data to  display  EKG   Radiology No results found.  Procedures Procedures (including critical care time)  Medications Ordered in UC Medications - No data to display  Initial Impression / Assessment and Plan / UC Course  I have reviewed the triage vital signs and the nursing notes.  Pertinent labs & imaging results that were available during my care of the patient were reviewed by me and considered in my medical decision making (see chart for details).     Lavaged with warm water and peroxide performed today in clinic, full clearance of a significant amount of cerumen to the right ear canal and moderate from the left.  Very small portion of cerumen still present in the left ear canal against the tympanic membrane despite multiple washes.  Bilateral TMs visible post lavage and intact, benign.  Continue the Debrox drops and allow the shower water to rinse away the remaining wax.  May use the Debrox drops moving forward for prevention.  Final Clinical Impressions(s) / UC Diagnoses   Final diagnoses:  Bilateral impacted cerumen     Discharge Instructions      Continue the Debrox drops as needed for prevention going forward.  Try to avoid Q-tips as much as possible.     ED Prescriptions   None    PDMP not reviewed this encounter.   Volney American, [place] 01/10/21 1443    Volney American, [place] 01/10/21 1443

## 2021-01-10 NOTE — Discharge Instructions (Addendum)
Continue the Debrox drops as needed for prevention going forward.  Try to avoid Q-tips as much as possible.

## 2021-01-16 DIAGNOSIS — F4323 Adjustment disorder with mixed anxiety and depressed mood: Secondary | ICD-10-CM | POA: Diagnosis not present

## 2021-01-30 DIAGNOSIS — F4323 Adjustment disorder with mixed anxiety and depressed mood: Secondary | ICD-10-CM | POA: Diagnosis not present

## 2021-01-31 DIAGNOSIS — E669 Obesity, unspecified: Secondary | ICD-10-CM | POA: Diagnosis not present

## 2021-01-31 DIAGNOSIS — E7800 Pure hypercholesterolemia, unspecified: Secondary | ICD-10-CM | POA: Diagnosis not present

## 2021-01-31 DIAGNOSIS — Z0000 Encounter for general adult medical examination without abnormal findings: Secondary | ICD-10-CM | POA: Diagnosis not present

## 2021-02-06 DIAGNOSIS — F4323 Adjustment disorder with mixed anxiety and depressed mood: Secondary | ICD-10-CM | POA: Diagnosis not present

## 2021-02-20 DIAGNOSIS — F4323 Adjustment disorder with mixed anxiety and depressed mood: Secondary | ICD-10-CM | POA: Diagnosis not present

## 2021-02-27 DIAGNOSIS — F4323 Adjustment disorder with mixed anxiety and depressed mood: Secondary | ICD-10-CM | POA: Diagnosis not present

## 2021-03-20 DIAGNOSIS — F4323 Adjustment disorder with mixed anxiety and depressed mood: Secondary | ICD-10-CM | POA: Diagnosis not present

## 2021-03-27 DIAGNOSIS — F4323 Adjustment disorder with mixed anxiety and depressed mood: Secondary | ICD-10-CM | POA: Diagnosis not present

## 2021-04-10 DIAGNOSIS — F4323 Adjustment disorder with mixed anxiety and depressed mood: Secondary | ICD-10-CM | POA: Diagnosis not present

## 2021-05-09 DIAGNOSIS — F4323 Adjustment disorder with mixed anxiety and depressed mood: Secondary | ICD-10-CM | POA: Diagnosis not present

## 2021-05-17 ENCOUNTER — Other Ambulatory Visit: Payer: Self-pay | Admitting: Family Medicine

## 2021-05-17 DIAGNOSIS — Z1231 Encounter for screening mammogram for malignant neoplasm of breast: Secondary | ICD-10-CM

## 2021-05-23 DIAGNOSIS — F4323 Adjustment disorder with mixed anxiety and depressed mood: Secondary | ICD-10-CM | POA: Diagnosis not present

## 2021-06-06 DIAGNOSIS — F4323 Adjustment disorder with mixed anxiety and depressed mood: Secondary | ICD-10-CM | POA: Diagnosis not present

## 2021-06-20 DIAGNOSIS — F32A Depression, unspecified: Secondary | ICD-10-CM | POA: Diagnosis not present

## 2021-07-17 ENCOUNTER — Ambulatory Visit
Admission: RE | Admit: 2021-07-17 | Discharge: 2021-07-17 | Disposition: A | Discharge status: Home / Self Care | Payer: BC Managed Care – PPO | Source: Ambulatory Visit | Attending: Family Medicine | Admitting: Family Medicine

## 2021-07-17 DIAGNOSIS — Z1231 Encounter for screening mammogram for malignant neoplasm of breast: Secondary | ICD-10-CM

## 2021-07-24 ENCOUNTER — Other Ambulatory Visit: Payer: Self-pay

## 2021-07-24 ENCOUNTER — Ambulatory Visit
Admission: RE | Admit: 2021-07-24 | Discharge: 2021-07-24 | Disposition: A | Discharge status: Home / Self Care | Payer: BC Managed Care – PPO | Source: Ambulatory Visit | Attending: Emergency Medicine | Admitting: Emergency Medicine

## 2021-07-24 VITALS — BP 127/83 | HR 73 | Temp 98.3°F | Resp 19

## 2021-07-24 DIAGNOSIS — H6121 Impacted cerumen, right ear: Secondary | ICD-10-CM | POA: Diagnosis not present

## 2021-07-24 DIAGNOSIS — H6120 Impacted cerumen, unspecified ear: Secondary | ICD-10-CM

## 2021-07-24 MED ORDER — FLUOCINOLONE ACETONIDE 0.01 % OT OIL: 5.0000 [drp] | TOPICAL_OIL | Freq: Two times a day (BID) | OTIC | 0 refills | Status: DC | PRN

## 2021-07-24 NOTE — Discharge Instructions (Addendum)
Thank you for coming back to see [place] here urgent care.  I am sorry that your visit is under the same circumstances as before and I wish I had better news for you. ? ?The reality with earwax is that we do not make more as we get older, the little tiny hairs in her ears that are responsible for pushing the wax out on a regular basis become weaker and less efficient.  I enclose some information about how to irrigate your ears at home as well as some do's and don'ts of earwax removal. ? ?To address any possible eczema in your ears, please instill 4-5 drops of Derm otic oil in each ear daily for the next week, then decrease to once weekly as needed. ? ?We are happy to see you anytime to perform this procedure for you and to check your ears when you are concerned.  You may also consider seeing an ear nose and throat specialist to see if there are any other strategies they might recommend. ? ?Again, it is a pleasure to participate in your care. ?

## 2021-07-24 NOTE — ED Triage Notes (Signed)
Pt presents with complaints of ears feeling clogged x 2 weeks. Pt has used otc ear wax removal.  ?

## 2021-07-24 NOTE — ED Provider Notes (Signed)
?[facility] ? ? ? ?CSN: [id number] ?Arrival date & time: 07/24/21  [id number] ?  ? ?HISTORY  ? ?Chief Complaint  ?Patient presents with  ? Otalgia  ? ?HPI ?Jeanette Bentley is a 66 y.o. female. Patient complains of recurrence of ears feeling clogged for the past 2 weeks.  Patient states she was here about 6 months ago for the same issue and her ears were irrigated with good success.  Patient states she wears ear buds to listen to music and podcasts.  Patient states she is been using over-the-counter Debrox solution but has not been irrigating after using.  Patient states her right ear is much worse than the left.  Patient states he is having hard time hearing out of her right ear which was the main reason she came in today.  Patient denies pain in either ear. ? ?The history is provided by the patient.  ?Past Medical History:  ?Diagnosis Date  ? Anemia   ? Hyperlipidemia   ? ?Patient Active Problem List  ? Diagnosis Date Noted  ? Neck pain 09/15/2012  ? ?Past Surgical History:  ?Procedure Laterality Date  ? CESAREAN SECTION    ? WISDOM TOOTH EXTRACTION    ? ?OB History   ?No obstetric history on file. ?  ? ?Home Medications   ? ?Prior to Admission medications   ?Medication Sig Start Date End Date Taking? Authorizing Provider  ?Cholecalciferol (VITAMIN D-3) 125 MCG (5000 UT) TABS 1 tablet    [provider]  ?Astrid Drafts 1000 MG CAPS 2 capsules    [provider]  ?Red Yeast Rice 600 MG CAPS 4 capsules    [provider]  ? ?Family History ?Family History  ?Problem Relation Age of Onset  ? Arthritis Mother   ? Asthma Mother   ? Ulcers Mother   ? Heart disease Father   ? Hypertension Father   ? Kidney disease Sister   ? Hypertension Brother   ? Diabetes Sister   ? Anxiety disorder Daughter   ? ?Social History ?Social History  ? ?Tobacco Use  ? Smoking status: Never  ?Substance Use Topics  ? Alcohol use: Yes  ? Drug use: No  ? ?Allergies   ?Patient has no known allergies. ? ?Review of  Systems ?Review of Systems ?Pertinent findings noted in history of present illness.  ? ?Physical Exam ?Triage Vital Signs ?ED Triage Vitals  ?Enc Vitals Group  ?   BP 02/23/21 0827 (!) 147/82  ?   Pulse Rate 02/23/21 0827 72  ?   Resp 02/23/21 0827 18  ?   Temp 02/23/21 0827 98.3 ?F (36.8 ?C)  ?   Temp Source 02/23/21 0827 Oral  ?   SpO2 02/23/21 0827 98 %  ?   Weight --   ?   Height --   ?   Head Circumference --   ?   Peak Flow --   ?   Pain Score 02/23/21 0826 5  ?   Pain Loc --   ?   Pain Edu? --   ?   Excl. in [place]? --   ?No data found. ? ?Updated Vital Signs ?BP 127/83   Pulse 73   Temp 98.3 ?F (36.8 ?C)   Resp 19   SpO2 96%  ? ?Physical Exam ?Vitals and nursing note reviewed.  ?Constitutional:   ?   General: She is not in acute distress. ?   Appearance: Normal appearance. She is not ill-appearing.  ?  HENT:  ?   Head: Normocephalic and atraumatic.  ?   Salivary Glands: Right salivary gland is not diffusely enlarged or tender. Left salivary gland is not diffusely enlarged or tender.  ?   Right Ear: External ear normal. Decreased hearing noted. There is impacted cerumen.  ?   Left Ear: Hearing, tympanic membrane, ear canal and external ear normal. No drainage.  No middle ear effusion. There is no impacted cerumen. Tympanic membrane is not erythematous or bulging.  ?   Nose: Nose normal. No nasal deformity, septal deviation, mucosal edema, congestion or rhinorrhea.  ?   Right Turbinates: Not enlarged, swollen or pale.  ?   Left Turbinates: Not enlarged, swollen or pale.  ?   Right Sinus: No maxillary sinus tenderness or frontal sinus tenderness.  ?   Left Sinus: No maxillary sinus tenderness or frontal sinus tenderness.  ?   Mouth/Throat:  ?   Lips: Pink. No lesions.  ?   Mouth: Mucous membranes are moist. No oral lesions.  ?   Pharynx: Oropharynx is clear. Uvula midline. No posterior oropharyngeal erythema or uvula swelling.  ?   Tonsils: No tonsillar exudate. 0 on the right. 0 on the left.  ?Eyes:  ?    General: Lids are normal.     ?   Right eye: No discharge.     ?   Left eye: No discharge.  ?   Extraocular Movements: Extraocular movements intact.  ?   Conjunctiva/sclera: Conjunctivae normal.  ?   Right eye: Right conjunctiva is not injected.  ?   Left eye: Left conjunctiva is not injected.  ?Neck:  ?   Trachea: Trachea and phonation normal.  ?Cardiovascular:  ?   Rate and Rhythm: Normal rate and regular rhythm.  ?   Pulses: Normal pulses.  ?   Heart sounds: Normal heart sounds. No murmur heard. ?  No friction rub. No gallop.  ?Pulmonary:  ?   Effort: Pulmonary effort is normal. No accessory muscle usage, prolonged expiration or respiratory distress.  ?   Breath sounds: Normal breath sounds. No stridor, decreased air movement or transmitted upper airway sounds. No decreased breath sounds, wheezing, rhonchi or rales.  ?Chest:  ?   Chest wall: No tenderness.  ?Musculoskeletal:     ?   General: Normal range of motion.  ?   Cervical back: Normal range of motion and neck supple. Normal range of motion.  ?Lymphadenopathy:  ?   Cervical: No cervical adenopathy.  ?Skin: ?   General: Skin is warm and dry.  ?   Findings: No erythema or rash.  ?Neurological:  ?   General: No focal deficit present.  ?   Mental Status: She is alert and oriented to person, place, and time.  ?Psychiatric:     ?   Mood and Affect: Mood normal.     ?   Behavior: Behavior normal.  ? ? ?Visual Acuity ?Right Eye Distance:   ?Left Eye Distance:   ?Bilateral Distance:   ? ?Right Eye Near:   ?Left Eye Near:    ?Bilateral Near:    ? ?UC Couse / Diagnostics / Procedures:  ?  ?EKG ? ?Radiology ?No results found. ? ?Procedures ?Ear Cerumen Removal ? ?Date/Time: 07/24/2021 9:21 AM ?Performed by: Shon Hough, RN ?Authorized by: Lynden Oxford Scales, PA-C  ? ?Consent:  ?  Consent obtained:  Verbal ?  Consent given by:  Patient ?  Risks, benefits, and alternatives were discussed: yes   ?  Risks discussed:  Bleeding, dizziness, incomplete removal,  infection, pain and TM perforation ?  Alternatives discussed:  No treatment, delayed treatment, alternative treatment, observation and referral ?Universal protocol:  ?  Procedure explained and questions answered to patient or proxy's satisfaction: yes   ?  Patient identity confirmed:  Verbally with patient and arm band ?Procedure details:  ?  Location:  R ear and L ear ?  Procedure type: irrigation   ?  Procedure type comment:  Irrigation was unsuccessful so provider was able to remove the remaining debris with minimal curette manipulation ?  Procedure outcomes: cerumen removed   ?Post-procedure details:  ?  Inspection:  Ear canal clear, TM intact, no bleeding and macerated skin ?  Hearing quality:  Improved ?  Procedure completion:  Tolerated (including critical care time) ? ?UC Diagnoses / Final Clinical Impressions(s)   ?I have reviewed the triage vital signs and the nursing notes. ? ?Pertinent labs & imaging results that were available during my care of the patient were reviewed by me and considered in my medical decision making (see chart for details).   ?Final diagnoses:  ?Right ear impacted cerumen  ?Hearing loss due to cerumen impaction, right  ? ?Bilateral ears were successfully cleared of cerumen with irrigation as well as minimal curette usage.  Because there was a significant amount of skin bound to the wax removed, patient was provided with a prescription for DermOtic oil to use daily for the next week then decrease to once weekly.  Return precautions advised. ? ?ED Prescriptions   ? ? Medication Sig Dispense Auth. Provider  ? Fluocinolone Acetonide (DERMOTIC) 0.01 % OIL Place 5 drops in ear(s) 2 (two) times daily as needed (Itching in ears). 20 mL Lynden Oxford Scales, PA-C  ? ?  ? ?PDMP not reviewed this encounter. ? ?Pending results:  ?Labs Reviewed - No data to display ? ?Medications Ordered in UC: ?Medications - No data to display ? ?Disposition Upon Discharge:  ?Condition: stable for discharge  home ?Home: take medications as prescribed; routine discharge instructions as discussed; follow up as advised. ? ?Patient presented with an acute illness with associated systemic symptoms and significant discomfort r

## 2022-06-25 ENCOUNTER — Other Ambulatory Visit: Payer: Self-pay | Admitting: Family Medicine

## 2022-06-25 DIAGNOSIS — Z1231 Encounter for screening mammogram for malignant neoplasm of breast: Secondary | ICD-10-CM

## 2022-07-19 IMAGING — MG MM DIGITAL SCREENING BILAT W/ TOMO AND CAD
8 series · 8 of 24 positions shown · non-contrast
Comparison: Previous exam(s).

CLINICAL DATA: Screening.

EXAM:
DIGITAL SCREENING BILATERAL MAMMOGRAM WITH TOMOSYNTHESIS AND CAD
TECHNIQUE: Bilateral screening digital craniocaudal and mediolateral oblique
mammograms were obtained. Bilateral screening digital breast
tomosynthesis was performed. The images were evaluated with
computer-aided detection.

[R CC synth-2D]
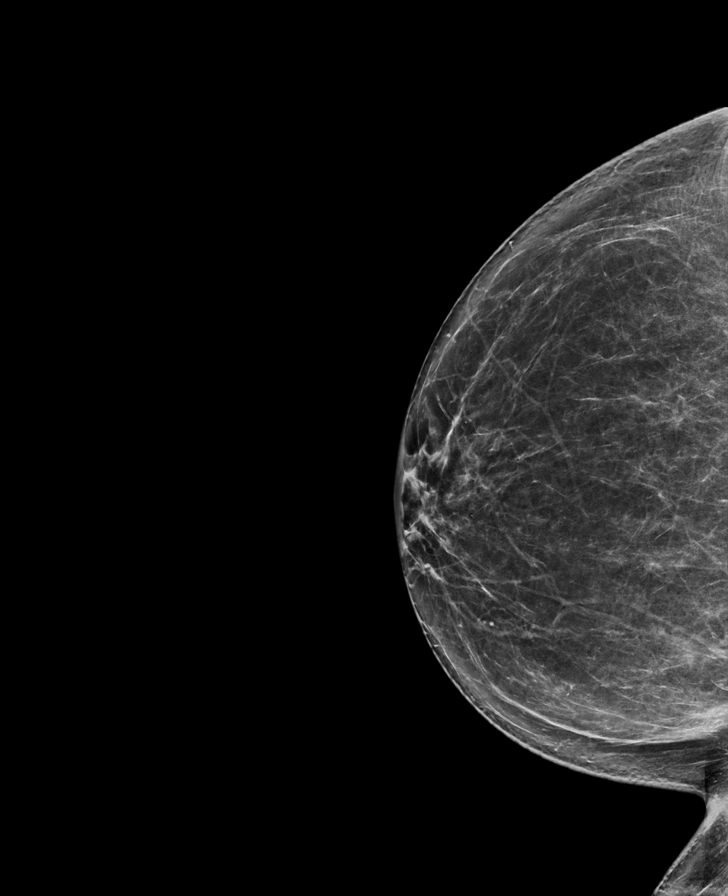

[R MLO synth-2D]
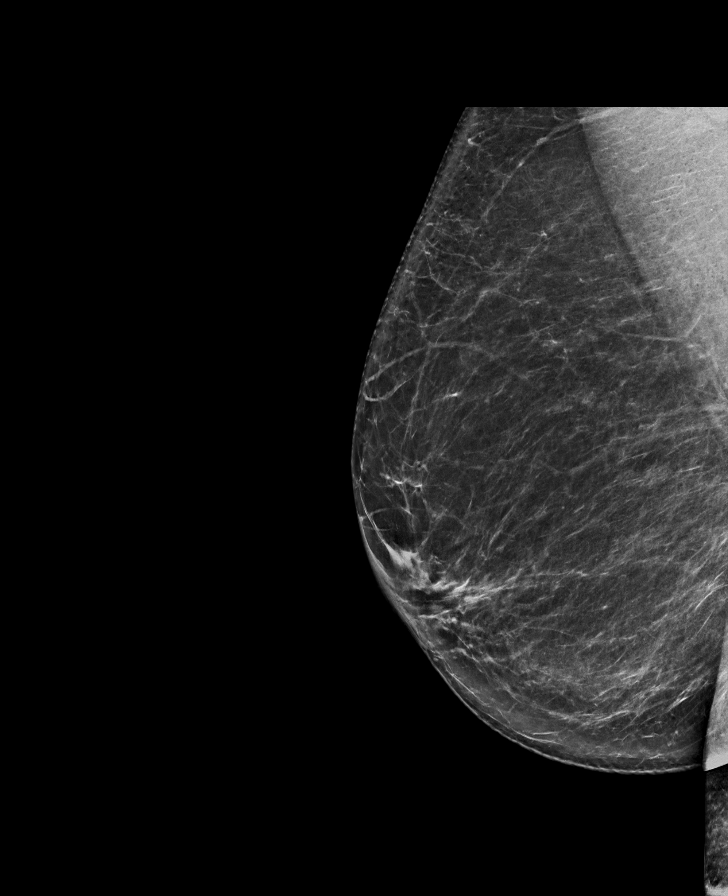

[L CC synth-2D]
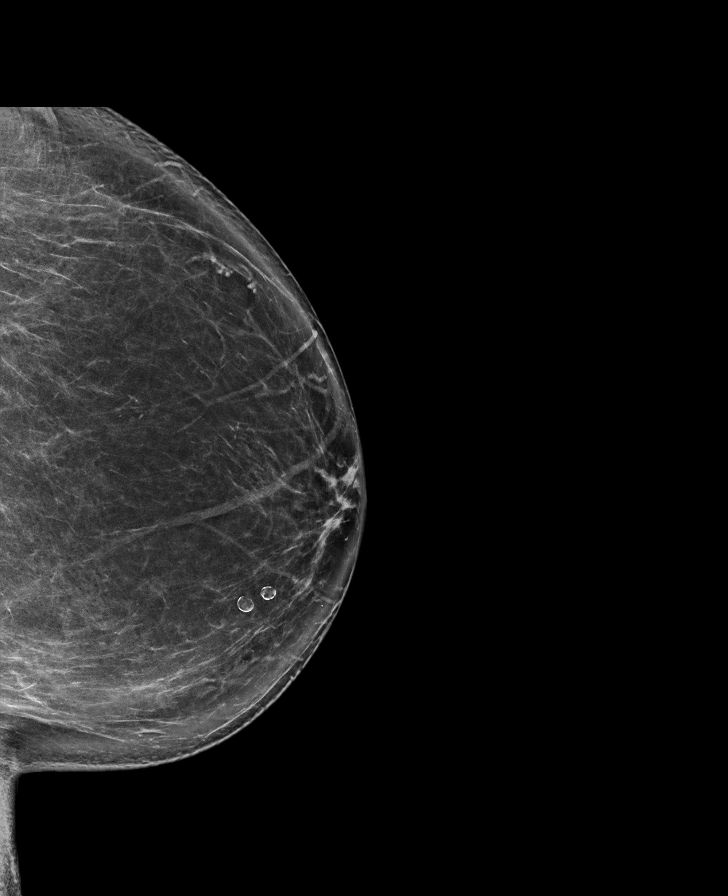

[L MLO synth-2D]
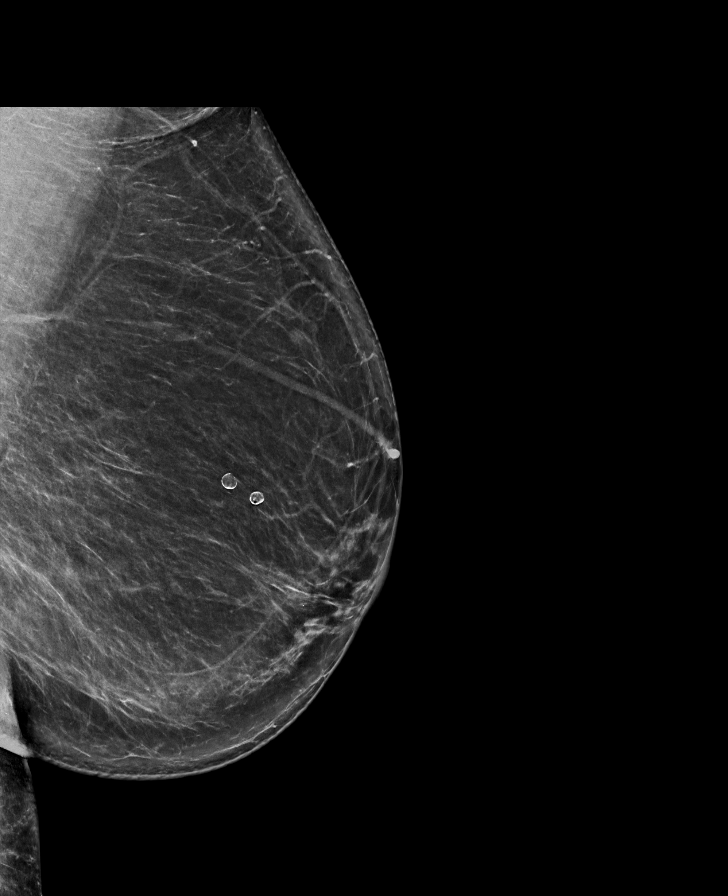

[L MLO tomo · tomo slice 45/90.0]
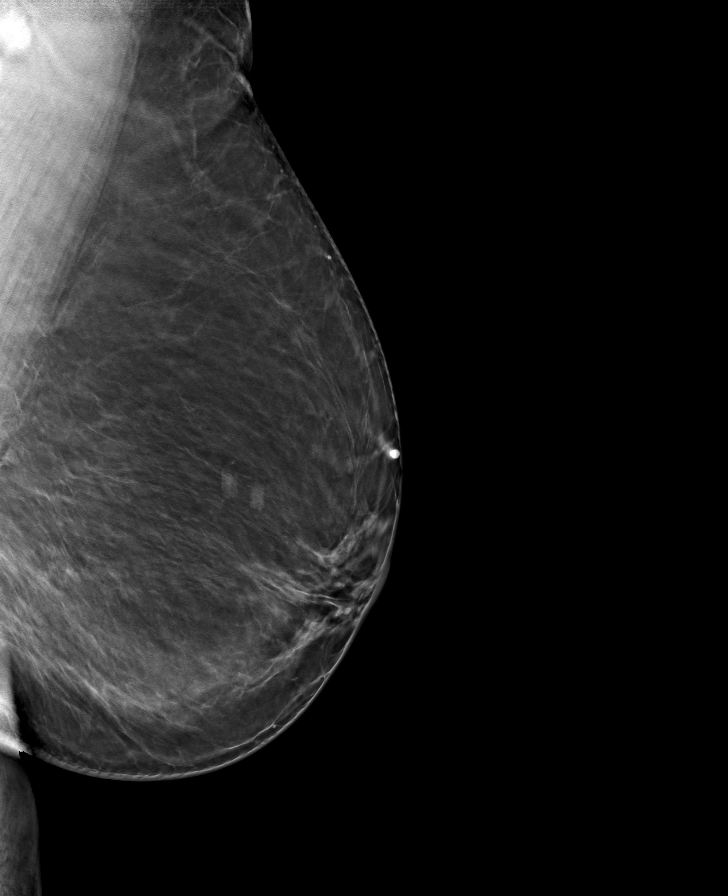

[L CC tomo · tomo slice 47/92.0]
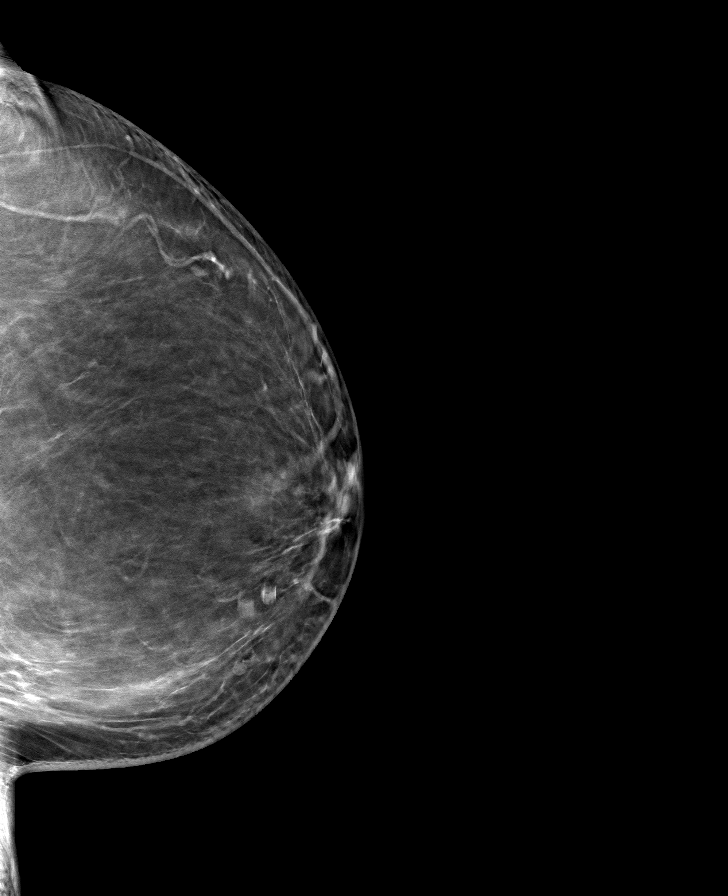

[R MLO tomo · tomo slice 44/87.0]
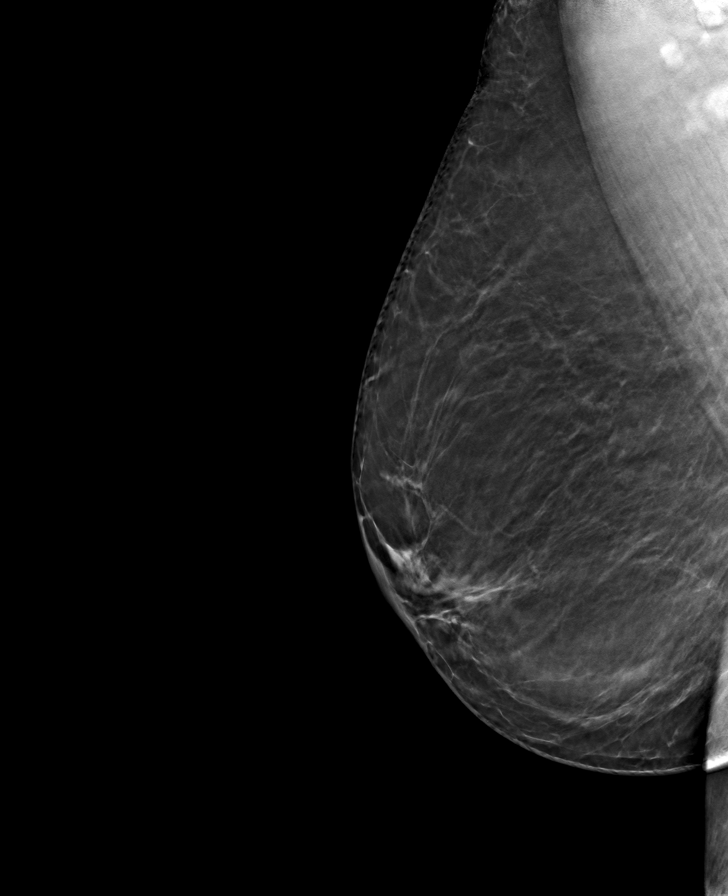

[R CC tomo · tomo slice 41/82.0]
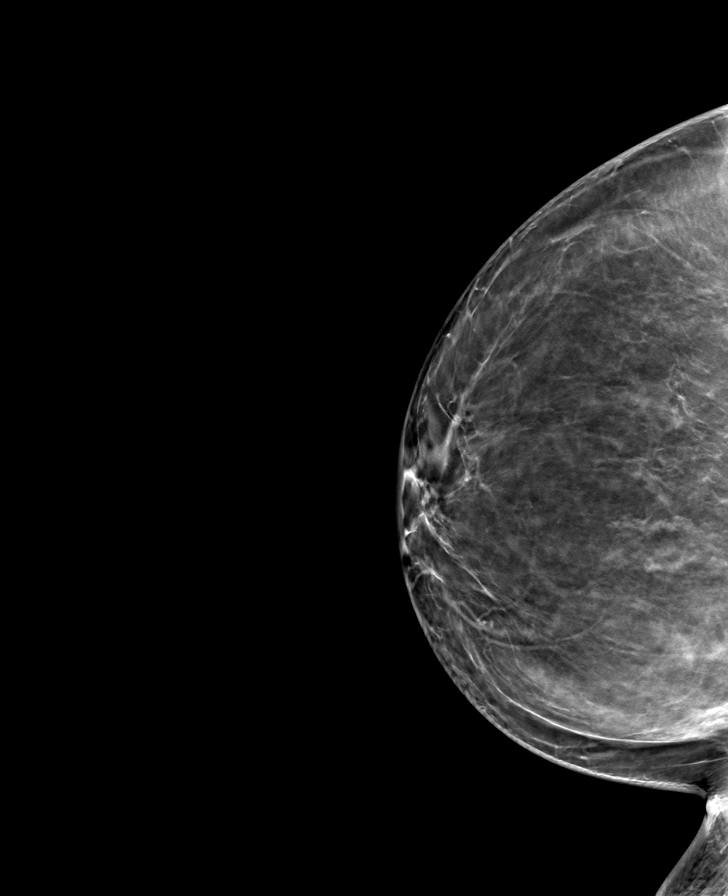

[8 of 24 positions shown; findings below may reference images not displayed]

ACR Breast Density Category b: There are scattered areas of
fibroglandular density.
FINDINGS: There are no findings suspicious for malignancy.
IMPRESSION: No mammographic evidence of malignancy. A result letter of this
screening mammogram will be mailed directly to the patient.

RECOMMENDATION:
Screening mammogram in one year. (Code:51-O-LD2)

BI-RADS CATEGORY  1: Negative.

## 2022-08-14 ENCOUNTER — Ambulatory Visit
Admission: RE | Admit: 2022-08-14 | Discharge: 2022-08-14 | Disposition: A | Discharge status: Home / Self Care | Payer: Medicare Other | Source: Ambulatory Visit | Attending: Family Medicine | Admitting: Family Medicine

## 2022-08-14 DIAGNOSIS — Z1231 Encounter for screening mammogram for malignant neoplasm of breast: Secondary | ICD-10-CM

## 2022-08-27 ENCOUNTER — Ambulatory Visit: Payer: Self-pay

## 2022-08-29 ENCOUNTER — Ambulatory Visit
Admission: RE | Admit: 2022-08-29 | Discharge: 2022-08-29 | Disposition: A | Discharge status: Home / Self Care | Payer: Medicare Other | Source: Ambulatory Visit

## 2022-08-29 VITALS — BP 143/85 | HR 70 | Temp 98.8°F | Resp 18

## 2022-08-29 DIAGNOSIS — J209 Acute bronchitis, unspecified: Secondary | ICD-10-CM | POA: Diagnosis not present

## 2022-08-29 MED ORDER — CETIRIZINE HCL 10 MG PO TABS: 10.0000 mg | ORAL_TABLET | Freq: Every day | ORAL | 0 refills | Status: DC

## 2022-08-29 MED ORDER — PROMETHAZINE-DM 6.25-15 MG/5ML PO SYRP: 2.5000 mL | ORAL_SOLUTION | Freq: Three times a day (TID) | ORAL | 0 refills | Status: DC | PRN

## 2022-08-29 MED ORDER — PREDNISONE 10 MG PO TABS: 30.0000 mg | ORAL_TABLET | Freq: Every day | ORAL | 0 refills | Status: DC

## 2022-08-29 NOTE — ED Triage Notes (Signed)
Pt reports cough and chest congestion x 1 week. OTC meds gives some relief.

## 2022-08-29 NOTE — Discharge Instructions (Signed)
We are managing your illness for bronchitis with prednisone, cough syrup and Zyrtec. Hydrate well, rest over the weekend. Use Tylenol for aches and pains.

## 2022-08-29 NOTE — ED Provider Notes (Signed)
[facility]  Note:  This document was prepared using Conservation officer, historic buildings and may include unintentional dictation errors.  MRN: [id number] DOB: 03-10-1956  Subjective:   Jeanette Bentley is a 67 y.o. female presenting for 1 week history of coughing, chest congestion, sinus drainage. No fever, chest pain, shob, wheezing, throat pain, runny and stuffy nose. No smoking of any kind including cigarettes, cigars, vaping, marijuana use.  No asthma.   No current facility-administered medications for this encounter.  Current Outpatient Medications:    Bioflavonoid Products (ESTER-C) TABS, 1000 MG 2 tablets Orally once a day, Disp: , Rfl:    Cholecalciferol (VITAMIN D-3) 125 MCG (5000 UT) TABS, 1 tablet, Disp: , Rfl:    Fluocinolone Acetonide (DERMOTIC) 0.01 % OIL, Place 5 drops in ear(s) 2 (two) times daily as needed (Itching in ears)., Disp: 20 mL, Rfl: 0   Krill Oil 1000 MG CAPS, 2 capsules, Disp: , Rfl:    Probiotic CHEW, as directed Orally, Disp: , Rfl:    Red Yeast Rice 600 MG CAPS, 4 capsules, Disp: , Rfl:    No Known Allergies  Past Medical History:  Diagnosis Date   Anemia    Hyperlipidemia      Past Surgical History:  Procedure Laterality Date   CESAREAN SECTION     WISDOM TOOTH EXTRACTION      Family History  Problem Relation Age of Onset   Arthritis Mother    Asthma Mother    Ulcers Mother    Heart disease Father    Hypertension Father    Kidney disease Sister    Hypertension Brother    Diabetes Sister    Anxiety disorder Daughter     Social History   Tobacco Use   Smoking status: Never  Vaping Use   Vaping Use: Never used  Substance Use Topics   Alcohol use: Yes   Drug use: No    ROS   Objective:   Vitals: BP (!) 143/85 (BP Location: Right Arm)   Pulse 70   Temp 98.8 F (37.1 C) (Oral)   Resp 18   SpO2 95%   Physical Exam Constitutional:      General: She is not in acute distress.    Appearance:  Normal appearance. She is well-developed and normal weight. She is not ill-appearing, toxic-appearing or diaphoretic.  HENT:     Head: Normocephalic and atraumatic.     Right Ear: Tympanic membrane, ear canal and external ear normal. No drainage or tenderness. No middle ear effusion. There is no impacted cerumen. Tympanic membrane is not erythematous or bulging.     Left Ear: Tympanic membrane, ear canal and external ear normal. No drainage or tenderness.  No middle ear effusion. There is no impacted cerumen. Tympanic membrane is not erythematous or bulging.     Nose: Nose normal. No congestion or rhinorrhea.     Mouth/Throat:     Mouth: Mucous membranes are moist. No oral lesions.     Pharynx: No pharyngeal swelling, oropharyngeal exudate, posterior oropharyngeal erythema or uvula swelling.     Tonsils: No tonsillar exudate or tonsillar abscesses.  Eyes:     General: No scleral icterus.       Right eye: No discharge.        Left eye: No discharge.     Extraocular Movements: Extraocular movements intact.     Right eye: Normal extraocular motion.     Left eye: Normal extraocular motion.  Conjunctiva/sclera: Conjunctivae normal.  Cardiovascular:     Rate and Rhythm: Normal rate and regular rhythm.     Heart sounds: Normal heart sounds. No murmur heard.    No friction rub. No gallop.  Pulmonary:     Effort: Pulmonary effort is normal. No respiratory distress.     Breath sounds: No stridor. No wheezing, rhonchi or rales.  Chest:     Chest wall: No tenderness.  Musculoskeletal:     Cervical back: Normal range of motion and neck supple.  Lymphadenopathy:     Cervical: No cervical adenopathy.  Skin:    General: Skin is warm and dry.  Neurological:     General: No focal deficit present.     Mental Status: She is alert and oriented to person, place, and time.  Psychiatric:        Mood and Affect: Mood normal.        Behavior: Behavior normal.     Assessment and Plan :   PDMP not  reviewed this encounter.  1. Acute bronchitis, unspecified organism    Will manage with 30mg  prednisone for 5 days. Deferred imaging given clear cardiopulmonary exam, hemodynamically stable vital signs. Otherwise will manage for viral etiology for supportive care. Counseled patient on potential for adverse effects with medications prescribed/recommended today, ER and return-to-clinic precautions discussed, patient verbalized understanding.    Wallis Bamberg, [place] 08/29/22 1015

## 2022-11-28 ENCOUNTER — Ambulatory Visit
Admission: RE | Admit: 2022-11-28 | Discharge: 2022-11-28 | Disposition: A | Discharge status: Home / Self Care | Payer: Medicare Other | Source: Ambulatory Visit | Attending: Emergency Medicine | Admitting: Emergency Medicine

## 2022-11-28 VITALS — BP 131/86 | HR 71 | Temp 98.6°F | Resp 16

## 2022-11-28 DIAGNOSIS — H66001 Acute suppurative otitis media without spontaneous rupture of ear drum, right ear: Secondary | ICD-10-CM | POA: Diagnosis not present

## 2022-11-28 MED ORDER — CEFDINIR 300 MG PO CAPS: 300.0000 mg | ORAL_CAPSULE | Freq: Two times a day (BID) | ORAL | 0 refills | Status: AC

## 2022-11-28 MED ORDER — FLUOCINOLONE ACETONIDE 0.01 % OT OIL: 5.0000 [drp] | TOPICAL_OIL | Freq: Two times a day (BID) | OTIC | 0 refills | Status: AC | PRN

## 2022-11-28 NOTE — Discharge Instructions (Signed)
Please read below to learn more about the medications, dosages and frequencies that I recommend to help alleviate your symptoms and to get you feeling better soon:   Omnicef (cefdinir):  Please take one (1) dose twice daily for 10 days.  This antibiotic can cause upset stomach, this will resolve once antibiotics are complete.  You are welcome to take a probiotic, eat yogurt, take Imodium while taking this medication.  Please avoid other systemic medications such as Maalox, Pepto-Bismol or milk of magnesia as they can interfere with the body's ability to absorb the antibiotics.   DermOtic oil (fluocinolone): This is a topical steroid oil that you can use in your ear twice daily to alleviate pain, itching and swelling.     If symptoms have not meaningfully improved in the next 3 to 5 days, please return for repeat evaluation or follow-up with your regular provider.  If symptoms have worsened in the next 3 to 5 days, please return for repeat evaluation or follow-up with your regular provider.    Thank you for visiting urgent care today.  We appreciate the opportunity to participate in your care.

## 2022-11-28 NOTE — ED Provider Notes (Signed)
[facility]    CSN: [id number] Arrival date & time: 11/28/22  6213    HISTORY   Chief Complaint  Patient presents with   Ear Fullness    Hearing seems off,some ringing - Entered by patient   Appointment    0900   HPI Jeanette Bentley is a pleasant, 67 y.o. female who presents to urgent care today. Pt c/o decreased hearing, ringing in ears and ear fullness x 2-3 months.  States she has had similar symptoms in the past has been using a prescription fluocinolone oil in her ears without meaningful relief.  Patient denies ear pain, drainage from either ear.  Patient states she has not noticed 1 ear being worse than the left.    The history is provided by the patient.   Past Medical History:  Diagnosis Date   Anemia    Hyperlipidemia    Patient Active Problem List   Diagnosis Date Noted   Neck pain 09/15/2012   Past Surgical History:  Procedure Laterality Date   CESAREAN SECTION     WISDOM TOOTH EXTRACTION     OB History   No obstetric history on file.    Home Medications    Prior to Admission medications   Medication Sig Start Date End Date Taking? Authorizing Provider  cefdinir (OMNICEF) 300 MG capsule Take 1 capsule (300 mg total) by mouth 2 (two) times daily for 10 days. 11/28/22 12/08/22 Yes Theadora Rama Scales, PA-C  Bioflavonoid Products (ESTER-C) TABS 1000 MG 2 tablets Orally once a day    [provider]  cetirizine (ZYRTEC ALLERGY) 10 MG tablet Take 1 tablet (10 mg total) by mouth daily. 08/29/22   Wallis Bamberg, PA-C  Cholecalciferol (VITAMIN D-3) 125 MCG (5000 UT) TABS 1 tablet    [provider]  Fluocinolone Acetonide (DERMOTIC) 0.01 % OIL Place 5 drops in ear(s) 2 (two) times daily as needed (Itching in ears). 11/28/22   Theadora Rama Scales, PA-C  Krill Oil 1000 MG CAPS 2 capsules    [provider]  Probiotic CHEW as directed Orally    [provider]  Red Yeast Rice 600 MG CAPS 4 capsules    [provider]    Family History Family History  Problem Relation Age of Onset   Arthritis Mother    Asthma Mother    Ulcers Mother    Heart disease Father    Hypertension Father    Kidney disease Sister    Hypertension Brother    Diabetes Sister    Anxiety disorder Daughter    Social History Social History   Tobacco Use   Smoking status: Never  Vaping Use   Vaping status: Never Used  Substance Use Topics   Alcohol use: Yes   Drug use: No   Allergies   Patient has no known allergies.  Review of Systems Review of Systems Pertinent findings revealed after performing a 14 point review of systems has been noted in the history of present illness.  Physical Exam Vital Signs BP 131/86 (BP Location: Right Arm)   Pulse 71   Temp 98.6 F (37 C) (Oral)   Resp 16   SpO2 96%   No data found.  Physical Exam Vitals and nursing note reviewed.  Constitutional:      General: She is not in acute distress.    Appearance: Normal appearance. She is not ill-appearing.  HENT:     Head: Normocephalic and atraumatic.     Salivary Glands:  Right salivary gland is not diffusely enlarged or tender. Left salivary gland is not diffusely enlarged or tender.     Right Ear: Ear canal and external ear normal. No drainage. A middle ear effusion is present. There is no impacted cerumen. Tympanic membrane is injected, erythematous and bulging.     Left Ear: Tympanic membrane, ear canal and external ear normal. No drainage.  No middle ear effusion. There is no impacted cerumen. Tympanic membrane is not erythematous or bulging.     Nose: Nose normal. No nasal deformity, septal deviation, mucosal edema, congestion or rhinorrhea.     Right Turbinates: Not enlarged, swollen or pale.     Left Turbinates: Not enlarged, swollen or pale.     Right Sinus: No maxillary sinus tenderness or frontal sinus tenderness.     Left Sinus: No maxillary sinus tenderness or frontal sinus tenderness.     Mouth/Throat:      Lips: Pink. No lesions.     Mouth: Mucous membranes are moist. No oral lesions.     Pharynx: Oropharynx is clear. Uvula midline. No posterior oropharyngeal erythema or uvula swelling.     Tonsils: No tonsillar exudate. 0 on the right. 0 on the left.  Eyes:     General: Lids are normal.        Right eye: No discharge.        Left eye: No discharge.     Extraocular Movements: Extraocular movements intact.     Conjunctiva/sclera: Conjunctivae normal.     Right eye: Right conjunctiva is not injected.     Left eye: Left conjunctiva is not injected.  Neck:     Trachea: Trachea and phonation normal.  Cardiovascular:     Rate and Rhythm: Normal rate and regular rhythm.     Pulses: Normal pulses.     Heart sounds: Normal heart sounds. No murmur heard.    No friction rub. No gallop.  Pulmonary:     Effort: Pulmonary effort is normal. No accessory muscle usage, prolonged expiration or respiratory distress.     Breath sounds: Normal breath sounds. No stridor, decreased air movement or transmitted upper airway sounds. No decreased breath sounds, wheezing, rhonchi or rales.  Chest:     Chest wall: No tenderness.  Musculoskeletal:        General: Normal range of motion.     Cervical back: Normal range of motion and neck supple. Normal range of motion.  Lymphadenopathy:     Cervical: No cervical adenopathy.  Skin:    General: Skin is warm and dry.     Findings: No erythema or rash.  Neurological:     General: No focal deficit present.     Mental Status: She is alert and oriented to person, place, and time.  Psychiatric:        Mood and Affect: Mood normal.        Behavior: Behavior normal.     Visual Acuity Right Eye Distance:   Left Eye Distance:   Bilateral Distance:    Right Eye Near:   Left Eye Near:    Bilateral Near:     UC Couse / Diagnostics / Procedures:     Radiology No results found.  Procedures Procedures (including critical care time) EKG  Pending  results:  Labs Reviewed - No data to display  Medications Ordered in UC: Medications - No data to display  UC Diagnoses / Final Clinical Impressions(s)   I have reviewed the triage vital signs and  the nursing notes.  Pertinent labs & imaging results that were available during my care of the patient were reviewed by me and considered in my medical decision making (see chart for details).    Final diagnoses:  Acute suppurative otitis media of right ear without spontaneous rupture of tympanic membrane, recurrence not specified   Patient advised of physical exam findings concerning for acute right otitis media.  Okay to continue using fluocinolone oil.  Recommend 10-day course of cefdinir.  Conservative care recommended.  Return precautions advised.  Please see discharge instructions below for details of plan of care as provided to patient. ED Prescriptions     Medication Sig Dispense Auth. Provider   Fluocinolone Acetonide (DERMOTIC) 0.01 % OIL Place 5 drops in ear(s) 2 (two) times daily as needed (Itching in ears). 20 mL Theadora Rama Scales, PA-C   cefdinir (OMNICEF) 300 MG capsule Take 1 capsule (300 mg total) by mouth 2 (two) times daily for 10 days. 20 capsule Theadora Rama Scales, PA-C      PDMP not reviewed this encounter.  Pending results:  Labs Reviewed - No data to display  Discharge Instructions:   Discharge Instructions      Please read below to learn more about the medications, dosages and frequencies that I recommend to help alleviate your symptoms and to get you feeling better soon:   Omnicef (cefdinir):  Please take one (1) dose twice daily for 10 days.  This antibiotic can cause upset stomach, this will resolve once antibiotics are complete.  You are welcome to take a probiotic, eat yogurt, take Imodium while taking this medication.  Please avoid other systemic medications such as Maalox, Pepto-Bismol or milk of magnesia as they can interfere with the body's  ability to absorb the antibiotics.   DermOtic oil (fluocinolone): This is a topical steroid oil that you can use in your ear twice daily to alleviate pain, itching and swelling.     If symptoms have not meaningfully improved in the next 3 to 5 days, please return for repeat evaluation or follow-up with your regular provider.  If symptoms have worsened in the next 3 to 5 days, please return for repeat evaluation or follow-up with your regular provider.    Thank you for visiting urgent care today.  We appreciate the opportunity to participate in your care.     Disposition Upon Discharge:  Condition: stable for discharge home  Patient presented with an acute illness with associated systemic symptoms and significant discomfort requiring urgent management. In my opinion, this is a condition that a prudent lay person (someone who possesses an average knowledge of health and medicine) may potentially expect to result in complications if not addressed urgently such as respiratory distress, impairment of bodily function or dysfunction of bodily organs.   Routine symptom specific, illness specific and/or disease specific instructions were discussed with the patient and/or caregiver at length.   As such, the patient has been evaluated and assessed, work-up was performed and treatment was provided in alignment with urgent care protocols and evidence based medicine.  Patient/parent/caregiver has been advised that the patient may require follow up for further testing and treatment if the symptoms continue in spite of treatment, as clinically indicated and appropriate.  Patient/parent/caregiver has been advised to return to the [facility] or PCP if no better; to PCP or the Emergency Department if new signs and symptoms develop, or if the current signs or symptoms continue to change or worsen for further workup, evaluation and  treatment as clinically indicated and appropriate  The patient will follow up with their  current PCP if and as advised. If the patient does not currently have a PCP we will assist them in obtaining one.   The patient may need specialty follow up if the symptoms continue, in spite of conservative treatment and management, for further workup, evaluation, consultation and treatment as clinically indicated and appropriate.  Patient/parent/caregiver verbalized understanding and agreement of plan as discussed.  All questions were addressed during visit.  Please see discharge instructions below for further details of plan.  This office note has been dictated using Teaching laboratory technician.  Unfortunately, this method of dictation can sometimes lead to typographical or grammatical errors.  I apologize for your inconvenience in advance if this occurs.  Please do not hesitate to reach out to me if clarification is needed.      Theadora Rama Scales, [place] 11/28/22 [phone]

## 2022-11-28 NOTE — ED Triage Notes (Signed)
Pt reports hearing decrease, ringing in ears and ear fullness x 2-3 moths.

## 2023-04-09 ENCOUNTER — Other Ambulatory Visit: Payer: Self-pay | Admitting: Family Medicine

## 2023-04-09 DIAGNOSIS — Z13820 Encounter for screening for osteoporosis: Secondary | ICD-10-CM

## 2023-04-09 DIAGNOSIS — Z1231 Encounter for screening mammogram for malignant neoplasm of breast: Secondary | ICD-10-CM

## 2023-05-12 ENCOUNTER — Ambulatory Visit
Admission: RE | Admit: 2023-05-12 | Discharge: 2023-05-12 | Disposition: A | Discharge status: Home / Self Care | Payer: Medicare Other | Source: Ambulatory Visit

## 2023-05-12 VITALS — BP 129/81 | HR 72 | Temp 98.3°F | Resp 18

## 2023-05-12 DIAGNOSIS — H6121 Impacted cerumen, right ear: Secondary | ICD-10-CM | POA: Diagnosis not present

## 2023-05-12 DIAGNOSIS — H6993 Unspecified Eustachian tube disorder, bilateral: Secondary | ICD-10-CM

## 2023-05-12 MED ORDER — CARBAMIDE PEROXIDE 6.5 % OT SOLN: 5.0000 [drp] | Freq: Every day | OTIC | 0 refills | Status: AC | PRN

## 2023-05-12 NOTE — ED Triage Notes (Signed)
 Pt reports bilateral ear fullness x 1 month.

## 2023-05-12 NOTE — ED Provider Notes (Signed)
 [facility]  Note:  This document was prepared using Conservation officer, historic buildings and may include unintentional dictation errors.  MRN: [id number] DOB: 19-Oct-1955  Subjective:   Jeanette Bentley is a 68 y.o. female presenting for 1 month history of persistent bilateral ear fullness.  Right ear is worse than the left.  Has a history of cerumen impaction.  Does not use Q-tips anymore.  Has allergies but does not take anything consistently.  No fever, drainage, pain.  No current facility-administered medications for this encounter.  Current Outpatient Medications:    WHOLE GRAIN BARLEY,DIAGNOSTIC, IJ, Inject as directed., Disp: , Rfl:    Bioflavonoid Products (ESTER-C) TABS, 1000 MG 2 tablets Orally once a day, Disp: , Rfl:    cetirizine  (ZYRTEC  ALLERGY) 10 MG tablet, Take 1 tablet (10 mg total) by mouth daily., Disp: 30 tablet, Rfl: 0   Cholecalciferol (VITAMIN D-3) 125 MCG (5000 UT) TABS, 1 tablet, Disp: , Rfl:    Fluocinolone  Acetonide (DERMOTIC ) 0.01 % OIL, Place 5 drops in ear(s) 2 (two) times daily as needed (Itching in ears)., Disp: 20 mL, Rfl: 0   Krill Oil 1000 MG CAPS, 2 capsules, Disp: , Rfl:    Probiotic CHEW, as directed Orally, Disp: , Rfl:    Red Yeast Rice 600 MG CAPS, 4 capsules, Disp: , Rfl:    No Known Allergies  Past Medical History:  Diagnosis Date   Anemia    Hyperlipidemia      Past Surgical History:  Procedure Laterality Date   CESAREAN SECTION     WISDOM TOOTH EXTRACTION      Family History  Problem Relation Age of Onset   Arthritis Mother    Asthma Mother    Ulcers Mother    Heart disease Father    Hypertension Father    Kidney disease Sister    Hypertension Brother    Diabetes Sister    Anxiety disorder Daughter     Social History   Tobacco Use   Smoking status: Never  Vaping Use   Vaping status: Never Used  Substance Use Topics   Alcohol use: Yes   Drug use: No    ROS   Objective:    Vitals: BP 129/81 (BP Location: Right Arm)   Pulse 72   Temp 98.3 F (36.8 C) (Oral)   Resp 18   SpO2 96%   Physical Exam Constitutional:      General: She is not in acute distress.    Appearance: Normal appearance. She is well-developed. She is not ill-appearing, toxic-appearing or diaphoretic.  HENT:     Head: Normocephalic and atraumatic.     Right Ear: Tympanic membrane, ear canal and external ear normal. No tenderness. There is impacted cerumen. Tympanic membrane is not injected, perforated, erythematous or bulging.     Left Ear: Tympanic membrane, ear canal and external ear normal. No tenderness. There is no impacted cerumen. Tympanic membrane is not injected, perforated, erythematous or bulging.     Nose: Nose normal.     Mouth/Throat:     Mouth: Mucous membranes are moist.  Eyes:     General: No scleral icterus.       Right eye: No discharge.        Left eye: No discharge.     Extraocular Movements: Extraocular movements intact.  Cardiovascular:     Rate and Rhythm: Normal rate.  Pulmonary:     Effort: Pulmonary effort is normal.  Skin:  General: Skin is warm and dry.  Neurological:     General: No focal deficit present.     Mental Status: She is alert and oriented to person, place, and time.  Psychiatric:        Mood and Affect: Mood normal.        Behavior: Behavior normal.    Ear lavage performed using mixture of peroxide and water.  Pressure irrigation performed using a bottle and a thin ear tube.  Right ear lavage.  No curette was used.   Assessment and Plan :   PDMP not reviewed this encounter.  1. Impacted cerumen of right ear   2. Eustachian tube dysfunction, bilateral    Successful right ear lavage.  General management of cerumen impaction reviewed with patient.  Will use conservative management for what I suspect is eustachian tube dysfunction.  Recommended starting Flonase, Zyrtec , Sudafed. Counseled patient on potential for adverse effects  with medications prescribed/recommended today, ER and return-to-clinic precautions discussed, patient verbalized understanding.    Christopher Savannah, PA-C 05/12/23 1425

## 2023-06-01 ENCOUNTER — Ambulatory Visit
Admission: EM | Admit: 2023-06-01 | Discharge: 2023-06-01 | Disposition: A | Discharge status: Home / Self Care | Payer: Medicare Other | Attending: Family Medicine | Admitting: Family Medicine

## 2023-06-01 DIAGNOSIS — B9689 Other specified bacterial agents as the cause of diseases classified elsewhere: Secondary | ICD-10-CM

## 2023-06-01 DIAGNOSIS — J069 Acute upper respiratory infection, unspecified: Secondary | ICD-10-CM | POA: Diagnosis not present

## 2023-06-01 MED ORDER — CETIRIZINE HCL 10 MG PO TABS: 10.0000 mg | ORAL_TABLET | Freq: Every day | ORAL | 0 refills | Status: DC

## 2023-06-01 MED ORDER — AMOXICILLIN 875 MG PO TABS: 875.0000 mg | ORAL_TABLET | Freq: Two times a day (BID) | ORAL | 0 refills | Status: DC

## 2023-06-01 MED ORDER — PSEUDOEPHEDRINE HCL 30 MG PO TABS: 30.0000 mg | ORAL_TABLET | Freq: Three times a day (TID) | ORAL | 0 refills | Status: DC | PRN

## 2023-06-01 NOTE — Discharge Instructions (Signed)
We will manage this as a bacterial upper respiratory infection with amoxicillin. For sore throat or cough try using a honey-based tea. Use 3 teaspoons of honey with juice squeezed from half lemon. Place shaved pieces of ginger into 1/2-1 cup of water and warm over stove top. Then mix the ingredients and repeat every 4 hours as needed. Please take ibuprofen 600mg  every 6 hours with food alternating with OR taken together with Tylenol 500mg -650mg  every 6 hours for throat pain, fevers, aches and pains. Hydrate very well with at least 2 liters of water. Eat light meals such as soups (chicken and noodles, vegetable, chicken and wild rice).  Do not eat foods that you are allergic to.  Taking an antihistamine like Zyrtec (10mg  daily) can help against postnasal drainage, sinus congestion which can cause sinus pain, sinus headaches, throat pain, painful swallowing, coughing.  You can take this together with pseudoephedrine (Sudafed) at a dose of 30mg  3 times a day or twice daily as needed for the same kind of nasal drip, congestion.

## 2023-06-01 NOTE — ED Provider Notes (Signed)
[facility]  Note:  This document was prepared using Conservation officer, historic buildings and may include unintentional dictation errors.  MRN: [id number] DOB: 03-Jun-1955  Subjective:   Jeanette Bentley is a 68 y.o. female presenting for 1 week history of persistent headaches, bilateral ear pain and ear fullness, discomfort.  Patient feels central chest pain when she coughs and breathes.  No shortness of breath or wheezing.  No history of asthma.  No history of heart disease.  No smoking of any kind including cigarettes, cigars, vaping, marijuana use.    No current facility-administered medications for this encounter.  Current Outpatient Medications:    ibuprofen (ADVIL) 200 MG tablet, Take 200 mg by mouth every 6 (six) hours as needed., Disp: , Rfl:    Bioflavonoid Products (ESTER-C) TABS, 1000 MG 2 tablets Orally once a day, Disp: , Rfl:    carbamide peroxide (DEBROX) 6.5 % OTIC solution, Place 5 drops into both ears daily as needed., Disp: 15 mL, Rfl: 0   cetirizine (ZYRTEC ALLERGY) 10 MG tablet, Take 1 tablet (10 mg total) by mouth daily., Disp: 30 tablet, Rfl: 0   Cholecalciferol (VITAMIN D-3) 125 MCG (5000 UT) TABS, 1 tablet, Disp: , Rfl:    Fluocinolone Acetonide (DERMOTIC) 0.01 % OIL, Place 5 drops in ear(s) 2 (two) times daily as needed (Itching in ears)., Disp: 20 mL, Rfl: 0   Krill Oil 1000 MG CAPS, 2 capsules, Disp: , Rfl:    Probiotic CHEW, as directed Orally, Disp: , Rfl:    Red Yeast Rice 600 MG CAPS, 4 capsules, Disp: , Rfl:    saccharomyces boulardii (FLORASTOR) 250 MG capsule, 1 capsule Orally once a day, Disp: , Rfl:    WHOLE GRAIN BARLEY,DIAGNOSTIC, IJ, Inject as directed., Disp: , Rfl:    No Known Allergies  Past Medical History:  Diagnosis Date   Anemia    Hyperlipidemia      Past Surgical History:  Procedure Laterality Date   CESAREAN SECTION     WISDOM TOOTH EXTRACTION      Family History  Problem Relation Age of Onset    Arthritis Mother    Asthma Mother    Ulcers Mother    Heart disease Father    Hypertension Father    Kidney disease Sister    Hypertension Brother    Diabetes Sister    Anxiety disorder Daughter     Social History   Tobacco Use   Smoking status: Never  Vaping Use   Vaping status: Never Used  Substance Use Topics   Alcohol use: Yes   Drug use: No    ROS   Objective:   Vitals: BP 114/78 (BP Location: Left Arm)   Pulse 96   Temp 98.9 F (37.2 C) (Oral)   Resp 16   SpO2 95%   Physical Exam Constitutional:      General: She is not in acute distress.    Appearance: Normal appearance. She is well-developed and normal weight. She is not ill-appearing, toxic-appearing or diaphoretic.  HENT:     Head: Normocephalic and atraumatic.     Right Ear: Tympanic membrane, ear canal and external ear normal. No drainage or tenderness. No middle ear effusion. There is no impacted cerumen. Tympanic membrane is not erythematous or bulging.     Left Ear: Tympanic membrane, ear canal and external ear normal. No drainage or tenderness.  No middle ear effusion. There is no impacted cerumen. Tympanic membrane is not erythematous or  bulging.     Nose: Nose normal. No congestion or rhinorrhea.     Mouth/Throat:     Mouth: Mucous membranes are moist. No oral lesions.     Pharynx: No pharyngeal swelling, oropharyngeal exudate, posterior oropharyngeal erythema or uvula swelling.     Tonsils: No tonsillar exudate or tonsillar abscesses. 0 on the right. 0 on the left.  Eyes:     General: No scleral icterus.       Right eye: No discharge.        Left eye: No discharge.     Extraocular Movements: Extraocular movements intact.     Right eye: Normal extraocular motion.     Left eye: Normal extraocular motion.     Conjunctiva/sclera: Conjunctivae normal.  Cardiovascular:     Rate and Rhythm: Normal rate and regular rhythm.     Heart sounds: Normal heart sounds. No murmur heard.    No friction rub.  No gallop.  Pulmonary:     Effort: Pulmonary effort is normal. No respiratory distress.     Breath sounds: No stridor. No wheezing, rhonchi or rales.  Chest:     Chest wall: No tenderness.  Musculoskeletal:     Cervical back: Normal range of motion and neck supple.  Lymphadenopathy:     Cervical: No cervical adenopathy.  Skin:    General: Skin is warm and dry.  Neurological:     General: No focal deficit present.     Mental Status: She is alert and oriented to person, place, and time.  Psychiatric:        Mood and Affect: Mood normal.        Behavior: Behavior normal.       Assessment and Plan :   PDMP not reviewed this encounter.  1. Bacterial upper respiratory infection    Given timeline of illness deferred both COVID testing and flu testing.  Deferred imaging given clear cardiopulmonary exam, hemodynamically stable vital signs.  Will start empiric treatment for bacterial upper respiratory infection with amoxicillin.  Recommended supportive care otherwise. Counseled patient on potential for adverse effects with medications prescribed/recommended today, ER and return-to-clinic precautions discussed, patient verbalized understanding.    Wallis Bamberg, [place] 06/01/23 1112

## 2023-06-01 NOTE — ED Triage Notes (Signed)
Pt reports bilateral ear discomfort, headache, and pain when breathing, worse when lay down in bed x 1 week. Ibuprofen gives some relief.

## 2023-06-02 ENCOUNTER — Inpatient Hospital Stay (HOSPITAL_BASED_OUTPATIENT_CLINIC_OR_DEPARTMENT_OTHER)
Admission: EM | Admit: 2023-06-02 | Discharge: 2023-06-06 | DRG: 315 | Disposition: A | Discharge status: Home / Self Care | Payer: Medicare Other | Attending: Internal Medicine | Admitting: Internal Medicine

## 2023-06-02 ENCOUNTER — Other Ambulatory Visit: Payer: Self-pay

## 2023-06-02 ENCOUNTER — Emergency Department (HOSPITAL_BASED_OUTPATIENT_CLINIC_OR_DEPARTMENT_OTHER): Payer: Medicare Other | Admitting: Radiology

## 2023-06-02 ENCOUNTER — Encounter (HOSPITAL_BASED_OUTPATIENT_CLINIC_OR_DEPARTMENT_OTHER): Payer: Self-pay | Admitting: *Deleted

## 2023-06-02 ENCOUNTER — Emergency Department (HOSPITAL_BASED_OUTPATIENT_CLINIC_OR_DEPARTMENT_OTHER): Payer: Medicare Other

## 2023-06-02 DIAGNOSIS — N179 Acute kidney failure, unspecified: Secondary | ICD-10-CM | POA: Diagnosis present

## 2023-06-02 DIAGNOSIS — R000 Tachycardia, unspecified: Secondary | ICD-10-CM | POA: Diagnosis not present

## 2023-06-02 DIAGNOSIS — Z841 Family history of disorders of kidney and ureter: Secondary | ICD-10-CM | POA: Diagnosis not present

## 2023-06-02 DIAGNOSIS — Z818 Family history of other mental and behavioral disorders: Secondary | ICD-10-CM

## 2023-06-02 DIAGNOSIS — B349 Viral infection, unspecified: Secondary | ICD-10-CM | POA: Diagnosis not present

## 2023-06-02 DIAGNOSIS — J90 Pleural effusion, not elsewhere classified: Secondary | ICD-10-CM | POA: Diagnosis present

## 2023-06-02 DIAGNOSIS — G8929 Other chronic pain: Secondary | ICD-10-CM | POA: Diagnosis not present

## 2023-06-02 DIAGNOSIS — Z8249 Family history of ischemic heart disease and other diseases of the circulatory system: Secondary | ICD-10-CM

## 2023-06-02 DIAGNOSIS — R700 Elevated erythrocyte sedimentation rate: Secondary | ICD-10-CM | POA: Diagnosis not present

## 2023-06-02 DIAGNOSIS — D649 Anemia, unspecified: Secondary | ICD-10-CM | POA: Diagnosis not present

## 2023-06-02 DIAGNOSIS — Z79899 Other long term (current) drug therapy: Secondary | ICD-10-CM | POA: Diagnosis not present

## 2023-06-02 DIAGNOSIS — R7989 Other specified abnormal findings of blood chemistry: Secondary | ICD-10-CM | POA: Diagnosis not present

## 2023-06-02 DIAGNOSIS — Z825 Family history of asthma and other chronic lower respiratory diseases: Secondary | ICD-10-CM | POA: Diagnosis not present

## 2023-06-02 DIAGNOSIS — J069 Acute upper respiratory infection, unspecified: Secondary | ICD-10-CM | POA: Diagnosis not present

## 2023-06-02 DIAGNOSIS — Z1152 Encounter for screening for COVID-19: Secondary | ICD-10-CM | POA: Diagnosis not present

## 2023-06-02 DIAGNOSIS — M25511 Pain in right shoulder: Secondary | ICD-10-CM | POA: Diagnosis present

## 2023-06-02 DIAGNOSIS — E785 Hyperlipidemia, unspecified: Secondary | ICD-10-CM | POA: Diagnosis present

## 2023-06-02 DIAGNOSIS — Z833 Family history of diabetes mellitus: Secondary | ICD-10-CM | POA: Diagnosis not present

## 2023-06-02 DIAGNOSIS — Z8261 Family history of arthritis: Secondary | ICD-10-CM

## 2023-06-02 DIAGNOSIS — E663 Overweight: Secondary | ICD-10-CM | POA: Diagnosis present

## 2023-06-02 DIAGNOSIS — I3139 Other pericardial effusion (noninflammatory): Principal | ICD-10-CM | POA: Diagnosis present

## 2023-06-02 DIAGNOSIS — R55 Syncope and collapse: Secondary | ICD-10-CM | POA: Diagnosis present

## 2023-06-02 DIAGNOSIS — Z6829 Body mass index (BMI) 29.0-29.9, adult: Secondary | ICD-10-CM

## 2023-06-02 DIAGNOSIS — I309 Acute pericarditis, unspecified: Secondary | ICD-10-CM | POA: Diagnosis not present

## 2023-06-02 DIAGNOSIS — R079 Chest pain, unspecified: Secondary | ICD-10-CM

## 2023-06-02 HISTORY — DX: Other hemoglobinopathies: D58.2

## 2023-06-02 LAB — CBC
HCT: 28.1 % — ABNORMAL LOW (ref 36.0–46.0)
Hemoglobin: 10.1 g/dL — ABNORMAL LOW (ref 12.0–15.0)
MCH: 28.7 pg (ref 26.0–34.0)
MCHC: 35.9 g/dL (ref 30.0–36.0)
MCV: 79.8 fL — ABNORMAL LOW (ref 80.0–100.0)
Platelets: 470 10*3/uL — ABNORMAL HIGH (ref 150–400)
RBC: 3.52 MIL/uL — ABNORMAL LOW (ref 3.87–5.11)
RDW: 12.6 % (ref 11.5–15.5)
WBC: 12.2 10*3/uL — ABNORMAL HIGH (ref 4.0–10.5)
nRBC: 0 % (ref 0.0–0.2)

## 2023-06-02 LAB — BASIC METABOLIC PANEL
Anion gap: 10 (ref 5–15)
BUN: 15 mg/dL (ref 8–23)
CO2: 24 mmol/L (ref 22–32)
Calcium: 9.2 mg/dL (ref 8.9–10.3)
Chloride: 102 mmol/L (ref 98–111)
Creatinine, Ser: 1.06 mg/dL — ABNORMAL HIGH (ref 0.44–1.00)
GFR, Estimated: 58 mL/min — ABNORMAL LOW (ref >60)
Glucose, Bld: 115 mg/dL — ABNORMAL HIGH (ref 70–99)
Potassium: 3.8 mmol/L (ref 3.5–5.1)
Sodium: 136 mmol/L (ref 135–145)

## 2023-06-02 LAB — RESP PANEL BY RT-PCR (RSV, FLU A&B, COVID)  RVPGX2
Influenza A by PCR: NEGATIVE
Influenza B by PCR: NEGATIVE
Resp Syncytial Virus by PCR: NEGATIVE
SARS Coronavirus 2 by RT PCR: NEGATIVE

## 2023-06-02 LAB — TROPONIN I (HIGH SENSITIVITY)
Troponin I (High Sensitivity): 11 ng/L (ref <18)
Troponin I (High Sensitivity): 17 ng/L (ref <18)

## 2023-06-02 MED ORDER — MORPHINE SULFATE (PF) 4 MG/ML IV SOLN
4.0000 mg | Freq: Once | INTRAVENOUS | Status: AC
  Administered 2023-06-02: 4 mg via INTRAVENOUS
  Filled 2023-06-02: qty 1

## 2023-06-02 MED ORDER — SODIUM CHLORIDE 0.9 % IV BOLUS
1000.0000 mL | Freq: Once | INTRAVENOUS | Status: AC
Start: 2023-06-02 — End: 2023-06-02
  Administered 2023-06-02: 1000 mL via INTRAVENOUS

## 2023-06-02 MED ORDER — ONDANSETRON HCL 4 MG/2ML IJ SOLN
4.0000 mg | Freq: Once | INTRAMUSCULAR | Status: AC
  Administered 2023-06-02: 4 mg via INTRAVENOUS
  Filled 2023-06-02: qty 2

## 2023-06-02 MED ORDER — IOHEXOL 350 MG/ML SOLN
100.0000 mL | Freq: Once | INTRAVENOUS | Status: AC | PRN
  Administered 2023-06-02: 75 mL via INTRAVENOUS

## 2023-06-02 NOTE — ED Provider Notes (Addendum)
[place] EMERGENCY DEPARTMENT AT [facility] Provider Note   CSN: [id number] Arrival date & time: 06/02/23  1552     History  Chief Complaint  Patient presents with   Weakness   URI   Fall    Jeanette Bentley is a 68 y.o. female.  68 yo F with a chief complaints of cough congestion fevers difficulty breathing going on for few days.  She was seen in urgent care was diagnosed with an upper respiratory illness.  She had up going home and passing out twice today.  Both times she was trying to do something.  She is walking up the stairs and got to the top and slowly felt lightheaded and then collapsed.  She struck the back of her head.  Later she was trying to get the mail and a similar event happened.  At that time she fell onto her right side and hurt her shoulder.  She then went back to urgent care and they were concerned about a PE and she was sent here for evaluation.   Weakness URI Fall       Home Medications Prior to Admission medications   Medication Sig Start Date End Date Taking? Authorizing Provider  amoxicillin (AMOXIL) 875 MG tablet Take 1 tablet (875 mg total) by mouth 2 (two) times daily. 06/01/23   Wallis Bamberg, PA-C  Bioflavonoid Products (ESTER-C) TABS 1000 MG 2 tablets Orally once a day    [provider]  carbamide peroxide (DEBROX) 6.5 % OTIC solution Place 5 drops into both ears daily as needed. 05/12/23   Wallis Bamberg, PA-C  cetirizine (ZYRTEC ALLERGY) 10 MG tablet Take 1 tablet (10 mg total) by mouth daily. 06/01/23   Wallis Bamberg, PA-C  Cholecalciferol (VITAMIN D-3) 125 MCG (5000 UT) TABS 1 tablet    [provider]  Fluocinolone Acetonide (DERMOTIC) 0.01 % OIL Place 5 drops in ear(s) 2 (two) times daily as needed (Itching in ears). 11/28/22   Theadora Rama Scales, PA-C  ibuprofen (ADVIL) 200 MG tablet Take 200 mg by mouth every 6 (six) hours as needed.    [provider]  Providence Lanius 1000 MG CAPS 2 capsules    [provider]  Probiotic CHEW as directed Orally    [provider]  pseudoephedrine (SUDAFED) 30 MG tablet Take 1 tablet (30 mg total) by mouth every 8 (eight) hours as needed for congestion. 06/01/23   Wallis Bamberg, PA-C  Red Yeast Rice 600 MG CAPS 4 capsules    [provider]  saccharomyces boulardii (FLORASTOR) 250 MG capsule 1 capsule Orally once a day    [provider]  WHOLE GRAIN BARLEY,DIAGNOSTIC, IJ Inject as directed.    [provider]      Allergies    Patient has no known allergies.    Review of Systems   Review of Systems  Neurological:  Positive for weakness.    Physical Exam Updated Vital Signs BP 134/80   Pulse 93   Temp 98.3 F (36.8 C) (Oral)   Resp (!) 29   SpO2 95%  Physical Exam Vitals and nursing note reviewed.  Constitutional:      General: She is not in acute distress.    Appearance: She is well-developed. She is not diaphoretic.  HENT:     Head: Normocephalic and atraumatic.  Eyes:     Pupils: Pupils are equal, round, and reactive to light.  Cardiovascular:     Rate and Rhythm: Normal rate  and regular rhythm.     Heart sounds: No murmur heard.    No friction rub. No gallop.  Pulmonary:     Effort: Pulmonary effort is normal.     Breath sounds: No wheezing or rales.  Abdominal:     General: There is no distension.     Palpations: Abdomen is soft.     Tenderness: There is no abdominal tenderness.  Musculoskeletal:        General: Tenderness present.     Cervical back: Normal range of motion and neck supple.     Comments: Mild pain about the right proximal humerus.  No obvious pain about the clavicle or the scapula or the [facility] joint.  Skin:    General: Skin is warm and dry.  Neurological:     Mental Status: She is alert and oriented to person, place, and time.  Psychiatric:        Behavior: Behavior normal.     ED Results / Procedures / Treatments   Labs (all labs ordered are listed, but only abnormal  results are displayed) Labs Reviewed  BASIC METABOLIC PANEL - Abnormal; Notable for the following components:      Result Value   Glucose, Bld 115 (*)    Creatinine, Ser 1.06 (*)    GFR, Estimated 58 (*)    All other components within normal limits  CBC - Abnormal; Notable for the following components:   WBC 12.2 (*)    RBC 3.52 (*)    Hemoglobin 10.1 (*)    HCT 28.1 (*)    MCV 79.8 (*)    Platelets 470 (*)    All other components within normal limits  RESP PANEL BY RT-PCR (RSV, FLU A&B, COVID)  [id number]  TROPONIN I (HIGH SENSITIVITY)  TROPONIN I (HIGH SENSITIVITY)    EKG EKG Interpretation Date/Time:  Monday June 02 2023 16:30:58 EST Ventricular Rate:  113 PR Interval:  152 QRS Duration:  64 QT Interval:  334 QTC Calculation: 458 R Axis:   46  Text Interpretation: Sinus tachycardia Low voltage QRS Cannot rule out Anterior infarct , age undetermined Abnormal ECG No old tracing to compare Confirmed by Melene Plan [phone]) on 06/02/2023 8:27:38 PM  Radiology DG Hip Unilat W or Wo Pelvis 2-3 Views Left Result Date: 06/02/2023 CLINICAL DATA:  Left hip pain after a fall EXAM: DG HIP (WITH OR WITHOUT PELVIS) 2-3V LEFT COMPARISON:  None Available. FINDINGS: Mild bilateral osteoarthritis of the hips. No evidence of acute fracture or dislocation. IMPRESSION: No acute or traumatic finding. Mild bilateral osteoarthritis of the hips. Electronically Signed   By: Paulina Fusi M.D.   On: 06/02/2023 21:52   DG Shoulder Right Result Date: 06/02/2023 CLINICAL DATA:  Right shoulder pain. Generalized weakness with 2 falls today. EXAM: RIGHT SHOULDER - 2+ VIEW COMPARISON:  None Available. FINDINGS: Degenerative changes in the acromioclavicular and glenohumeral joints with prominent subacromial and humeral osteophyte formation. Bone spurs demonstrate near bone on bone appearance at the subacromial space. No evidence of acute fracture or dislocation. No focal bone lesion or bone destruction. Soft tissues  are unremarkable. IMPRESSION: Prominent degenerative changes in the right shoulder. No acute displaced fractures identified. Electronically Signed   By: Burman Nieves M.D.   On: 06/02/2023 21:52   CT Angio Chest PE W and/or Wo Contrast Result Date: 06/02/2023 CLINICAL DATA:  Pulmonary embolus suspected with high probability. Generalized weakness and falls today. Shortness of breath and chest pressure. Recent diagnosis of upper respiratory infection  on amoxicillin. EXAM: CT ANGIOGRAPHY CHEST WITH CONTRAST TECHNIQUE: Multidetector CT imaging of the chest was performed using the standard protocol during bolus administration of intravenous contrast. Multiplanar CT image reconstructions and MIPs were obtained to evaluate the vascular anatomy. RADIATION DOSE REDUCTION: This exam was performed according to the departmental dose-optimization program which includes automated exposure control, adjustment of the mA and/or kV according to patient size and/or use of iterative reconstruction technique. CONTRAST:  75mL OMNIPAQUE IOHEXOL 350 MG/ML SOLN COMPARISON:  Chest radiograph 06/02/2023 FINDINGS: Cardiovascular: Technically adequate study with good opacification of the central and segmental pulmonary arteries. Mild motion and streak artifact. No focal filling defects. No evidence of significant pulmonary embolus. Normal heart size. Moderate pericardial effusion. Normal caliber thoracic aorta. No aortic dissection. Great vessel origins are patent. Mediastinum/Nodes: No enlarged mediastinal, hilar, or axillary lymph nodes. Thyroid gland, trachea, and esophagus demonstrate no significant findings. Lungs/Pleura: Small bilateral pleural effusions. Bandlike opacities in the lung bases likely represent scarring or atelectasis. No airspace disease or consolidation. Upper Abdomen: No acute abnormalities. Musculoskeletal: Degenerative changes in the spine. No acute bony abnormalities. Review of the MIP images confirms the above  findings. IMPRESSION: 1. No evidence of significant pulmonary embolus. 2. Moderate pericardial effusion. 3. Small bilateral pleural effusions. Linear atelectasis or scarring in the lung bases. Electronically Signed   By: Burman Nieves M.D.   On: 06/02/2023 21:36   CT Cervical Spine Wo Contrast Result Date: 06/02/2023 CLINICAL DATA:  Neck trauma. Generalized weakness with 2 falls today. EXAM: CT CERVICAL SPINE WITHOUT CONTRAST TECHNIQUE: Multidetector CT imaging of the cervical spine was performed without intravenous contrast. Multiplanar CT image reconstructions were also generated. RADIATION DOSE REDUCTION: This exam was performed according to the departmental dose-optimization program which includes automated exposure control, adjustment of the mA and/or kV according to patient size and/or use of iterative reconstruction technique. COMPARISON:  Cervical spine radiographs 09/15/2012 FINDINGS: Alignment: Normal alignment. Skull base and vertebrae: No vertebral compression deformities. No focal bone lesion or bone destruction. Bone cortex appears intact. Skull base appears intact. Soft tissues and spinal canal: No prevertebral soft tissue swelling. No abnormal paraspinal soft tissue mass or infiltration. Disc levels: Degenerative changes with disc space narrowing and endplate osteophyte formation throughout the cervical spine. Degenerative changes in the facet joints. Upper chest: Lung apices are clear. Other: None. IMPRESSION: Normal alignment of the cervical spine. Mild degenerative changes. No acute displaced fractures identified. Electronically Signed   By: Burman Nieves M.D.   On: 06/02/2023 21:32   CT Head Wo Contrast Result Date: 06/02/2023 CLINICAL DATA:  Minor head trauma. Generalized weakness and falls x2 today. EXAM: CT HEAD WITHOUT CONTRAST TECHNIQUE: Contiguous axial images were obtained from the base of the skull through the vertex without intravenous contrast. RADIATION DOSE REDUCTION: This  exam was performed according to the departmental dose-optimization program which includes automated exposure control, adjustment of the mA and/or kV according to patient size and/or use of iterative reconstruction technique. COMPARISON:  None Available. FINDINGS: Brain: No evidence of acute infarction, hemorrhage, hydrocephalus, extra-axial collection or mass lesion/mass effect. Vascular: No hyperdense vessel or unexpected calcification. Skull: Normal. Negative for fracture or focal lesion. Sinuses/Orbits: Paranasal sinuses and mastoid air cells are clear. Other: None. IMPRESSION: No acute intracranial abnormalities. Electronically Signed   By: Burman Nieves M.D.   On: 06/02/2023 21:29   DG Chest 2 View Result Date: 06/02/2023 CLINICAL DATA:  Upper respiratory infection. EXAM: CHEST - 2 VIEW COMPARISON:  None Available. FINDINGS: There are bibasilar  streaky atelectasis or scarring. Pneumonia is not excluded. Blunting of the costophrenic angles may be scarring or represent trace pleural effusion. No pneumothorax. The cardiac silhouette is within normal limits. Atherosclerotic calcification of the aorta. No acute osseous pathology. IMPRESSION: Bibasilar atelectasis or scarring. Pneumonia is not excluded. Electronically Signed   By: Elgie Collard M.D.   On: 06/02/2023 18:24    Procedures .Critical Care  Performed by: Melene Plan, DO Authorized by: Melene Plan, DO   Critical care provider statement:    Critical care time (minutes):  35   Critical care time was exclusive of:  Separately billable procedures and treating other patients   Critical care was time spent personally by me on the following activities:  Development of treatment plan with patient or surrogate, discussions with consultants, evaluation of patient's response to treatment, examination of patient, ordering and review of laboratory studies, ordering and review of radiographic studies, ordering and performing treatments and interventions,  pulse oximetry, re-evaluation of patient's condition and review of old charts   Care discussed with: admitting provider        EMERGENCY DEPARTMENT [place] CARDIAC EXAM "Study: Limited Ultrasound of the Heart and Pericardium"  INDICATIONS:Abnormal vital signs Multiple views of the heart and pericardium were obtained in real-time with a multi-frequency probe.  PERFORMED WU:[id number] IMAGES ARCHIVED?: Yes LIMITATIONS:  Emergent procedure VIEWS USED: Subcostal 4 chamber INTERPRETATION: Cardiac activity present, Pericardial effusion present, and Cardiac tamponade present    Medications Ordered in ED Medications  sodium chloride 0.9 % bolus 1,000 mL (0 mLs Intravenous Stopped 06/02/23 2237)  iohexol (OMNIPAQUE) 350 MG/ML injection 100 mL (75 mLs Intravenous Contrast Given 06/02/23 2114)  morphine (PF) 4 MG/ML injection 4 mg (4 mg Intravenous Given 06/02/23 2258)  ondansetron (ZOFRAN) injection 4 mg (4 mg Intravenous Given 06/02/23 2257)    ED Course/ Medical Decision Making/ A&P                                 Medical Decision Making Amount and/or Complexity of Data Reviewed Labs: ordered. Radiology: ordered.  Risk Prescription drug management.   68 yo F with a chief complaints of an event where she passed out.  She had to these happen today.  Both scented to exertional but had a prodrome prior to collapsing.  By history it sounds as if she was likely dehydrated with an upper respiratory illness.  She had been urgent care recently and was diagnosed as such and then when she came back after collapsing they told her that she needed a CT scan of her chest.  I think is less likely that she has a PE.  Will obtain CT imaging of the head C-spine chest.  CT of the head without obvious intracranial hemorrhage.  CT of the C-spine without obvious fracture.  CT angiogram of the chest is concerning for a pericardial effusion.  Bedside ultrasound consistent with the same.  There is some difficulty with RV  filling on my exam.  I discussed this with the cardiology fellow on-call Dr. Granville Lewis.  He reviewed her records and her vital signs here in the ER.  He did not feel strongly that she had tamponade.  Recommended medical admission and formal echocardiogram in the morning.  I am concerned as the patient had already passed out twice.  I discussed the case with Dr. Rodena Medin, accepts the patient in ED transfer.    I attempted to page the hospitalist  but when I inquired it had never been paged out.  I was told that would be done when the patient got to [facility]. Patient still needs to be discussed with the hospitalist.  The patients results and plan were reviewed and discussed.   Any x-rays performed were independently reviewed by myself.   Differential diagnosis were considered with the presenting HPI.  Medications  sodium chloride 0.9 % bolus 1,000 mL (0 mLs Intravenous Stopped 06/02/23 2237)  iohexol (OMNIPAQUE) 350 MG/ML injection 100 mL (75 mLs Intravenous Contrast Given 06/02/23 2114)  morphine (PF) 4 MG/ML injection 4 mg (4 mg Intravenous Given 06/02/23 2258)  ondansetron (ZOFRAN) injection 4 mg (4 mg Intravenous Given 06/02/23 2257)    Vitals:   06/02/23 2200 06/02/23 2215 06/02/23 2230 06/02/23 2245  BP: 127/76 133/83 128/81 134/80  Pulse: 94 92 94 93  Resp: (!) 29 (!) 28 (!) 31 (!) 29  Temp:   98.3 F (36.8 C)   TempSrc:   Oral   SpO2: 96% 96% 95% 95%    Final diagnoses:  Pericardial effusion    Admission/ observation were discussed with the admitting physician, patient and/or family and they are comfortable with the plan.         Final Clinical Impression(s) / ED Diagnoses Final diagnoses:  Pericardial effusion    Rx / DC Orders ED Discharge Orders     None           Melene Plan, DO 06/02/23 2327

## 2023-06-02 NOTE — ED Notes (Signed)
Patient arrives via [facility] from [facility] for admission to hospital. Patient presented to [facility] for syncopal episode x2. Noted to find pericardial effusion during ER visit. Patient alert, oriented and ambulatory on arrival. 20G RFA -saline locked.

## 2023-06-02 NOTE — ED Notes (Signed)
Patient in CT via stretcher.

## 2023-06-02 NOTE — ED Provider Notes (Signed)
Patient presents in transfer from [facility].  She was seen there for 2 episodes of exertional syncope earlier today.  CTA of chest showed concern of pericardial effusion.  Case was discussed with cardiology recommended medical admission and echocardiogram in the morning.  Physical Exam  BP 134/80   Pulse 93   Temp 98.3 F (36.8 C) (Oral)   Resp (!) 29   SpO2 95%   Physical Exam Vitals and nursing note reviewed.  Constitutional:      General: She is not in acute distress.    Appearance: Normal appearance. She is well-developed. She is not ill-appearing, toxic-appearing or diaphoretic.  HENT:     Head: Normocephalic and atraumatic.     Right Ear: External ear normal.     Left Ear: External ear normal.     Nose: Nose normal.     Mouth/Throat:     Mouth: Mucous membranes are moist.  Eyes:     Extraocular Movements: Extraocular movements intact.     Conjunctiva/sclera: Conjunctivae normal.  Cardiovascular:     Rate and Rhythm: Normal rate and regular rhythm.     Heart sounds: No murmur heard.    No friction rub.  Pulmonary:     Effort: Pulmonary effort is normal. No respiratory distress.     Breath sounds: Normal breath sounds. No wheezing or rales.  Chest:     Chest wall: No tenderness.  Abdominal:     General: There is no distension.     Palpations: Abdomen is soft.     Tenderness: There is no abdominal tenderness.  Musculoskeletal:        General: No swelling. Normal range of motion.     Cervical back: Normal range of motion and neck supple.  Skin:    General: Skin is warm and dry.     Coloration: Skin is not jaundiced or pale.  Neurological:     General: No focal deficit present.     Mental Status: She is alert and oriented to person, place, and time.  Psychiatric:        Mood and Affect: Mood normal.        Behavior: Behavior normal.     Procedures  Procedures  ED Course / MDM    Medical Decision Making Amount and/or Complexity of Data  Reviewed Labs: ordered. Radiology: ordered.  Risk Prescription drug management.   On assessment, patient resting comfortably.  Vital signs notable for mild tachypnea.  She denies any current chest pain at this time.  She does state that she has had ongoing substernal chest pain for the past week.  This is worsened at night, when she lays flat.  Pericardial effusion, identified earlier today, likely secondary to pericarditis.

## 2023-06-02 NOTE — ED Provider Triage Note (Signed)
Emergency Medicine Provider Triage Evaluation Note  Jeanette Bentley , a 68 y.o. female  was evaluated in triage.  Pt complains of chest "soreness" for past week. No cough, nasal congestion, hemoptysis, pedal edema, hx of clot  Felt dizzy following walking up stairs and went into daughter's bathroom. Syncope causing her to fall onto back of head. Also had fall with syncope outside while trying to get the mail and fell on right shoulder.  Went to UC yesterday and diagnosed with bacterial URI and prescribed amoxicillin.  Review of Systems  Positive: See hpi Negative: Fevers,  cough, nasal congestion, hemoptysis, pedal edema, hx of clot  Physical Exam  BP 108/69 (BP Location: Right Arm)   Pulse (!) 108   Temp 98.7 F (37.1 C)   Resp 20   SpO2 96%  Gen:   Awake, no distress   Resp:  Normal effort  MSK:   Moves extremities without difficulty  Other:    Medical Decision Making  Medically screening exam initiated at 8:04 PM.  Appropriate orders placed.  Jeanette Bentley was informed that the remainder of the evaluation will be completed by another provider, this initial triage assessment does not replace that evaluation, and the importance of remaining in the ED until their evaluation is complete.  XR inconclusive Trop 1 neg. CT head and CTPA ordered   Jeanette Sheen, PA 06/02/23 2013

## 2023-06-02 NOTE — ED Triage Notes (Signed)
Pt is here for generalized weakness and fall x2 today.  Pt was seen at [facility] yesterday for sob and chest pressure and was dx with URI and placed on amoxicillin.  Today she went back to [facility] and was told to come to ED for CT chest

## 2023-06-03 ENCOUNTER — Encounter (HOSPITAL_COMMUNITY): Payer: Self-pay | Admitting: Internal Medicine

## 2023-06-03 ENCOUNTER — Observation Stay (HOSPITAL_BASED_OUTPATIENT_CLINIC_OR_DEPARTMENT_OTHER): Payer: Medicare Other

## 2023-06-03 DIAGNOSIS — I3139 Other pericardial effusion (noninflammatory): Secondary | ICD-10-CM

## 2023-06-03 DIAGNOSIS — D649 Anemia, unspecified: Secondary | ICD-10-CM

## 2023-06-03 DIAGNOSIS — R072 Precordial pain: Secondary | ICD-10-CM

## 2023-06-03 DIAGNOSIS — I300 Acute nonspecific idiopathic pericarditis: Secondary | ICD-10-CM | POA: Diagnosis not present

## 2023-06-03 DIAGNOSIS — J90 Pleural effusion, not elsewhere classified: Secondary | ICD-10-CM

## 2023-06-03 DIAGNOSIS — R079 Chest pain, unspecified: Secondary | ICD-10-CM

## 2023-06-03 LAB — CBC
HCT: 24.6 % — ABNORMAL LOW (ref 36.0–46.0)
Hemoglobin: 8.7 g/dL — ABNORMAL LOW (ref 12.0–15.0)
MCH: 28.7 pg (ref 26.0–34.0)
MCHC: 35.4 g/dL (ref 30.0–36.0)
MCV: 81.2 fL (ref 80.0–100.0)
Platelets: 387 10*3/uL (ref 150–400)
RBC: 3.03 MIL/uL — ABNORMAL LOW (ref 3.87–5.11)
RDW: 12.8 % (ref 11.5–15.5)
WBC: 9.4 10*3/uL (ref 4.0–10.5)
nRBC: 0 % (ref 0.0–0.2)

## 2023-06-03 LAB — ECHOCARDIOGRAM COMPLETE
AR max vel: 2.61 cm2
AV Area VTI: 3 cm2
AV Area mean vel: 2.38 cm2
AV Mean grad: 2 mm[Hg]
AV Peak grad: 3 mm[Hg]
Ao pk vel: 0.87 m/s
Area-P 1/2: 4.29 cm2
Calc EF: 65.1 %
Height: 65 in
MV VTI: 2.22 cm2
S' Lateral: 2.4 cm
Single Plane A2C EF: 59.1 %
Single Plane A4C EF: 70.9 %
Weight: 2880 [oz_av]

## 2023-06-03 LAB — FOLATE: Folate: 7.5 ng/mL (ref >5.9)

## 2023-06-03 LAB — COMPREHENSIVE METABOLIC PANEL
ALT: 100 U/L — ABNORMAL HIGH (ref 0–44)
AST: 91 U/L — ABNORMAL HIGH (ref 15–41)
Albumin: 2.9 g/dL — ABNORMAL LOW (ref 3.5–5.0)
Alkaline Phosphatase: 86 U/L (ref 38–126)
Anion gap: 9 (ref 5–15)
BUN: 13 mg/dL (ref 8–23)
CO2: 24 mmol/L (ref 22–32)
Calcium: 8.6 mg/dL — ABNORMAL LOW (ref 8.9–10.3)
Chloride: 107 mmol/L (ref 98–111)
Creatinine, Ser: 1.18 mg/dL — ABNORMAL HIGH (ref 0.44–1.00)
GFR, Estimated: 51 mL/min — ABNORMAL LOW (ref >60)
Glucose, Bld: 105 mg/dL — ABNORMAL HIGH (ref 70–99)
Potassium: 3.8 mmol/L (ref 3.5–5.1)
Sodium: 140 mmol/L (ref 135–145)
Total Bilirubin: 0.5 mg/dL (ref 0.0–1.2)
Total Protein: 6 g/dL — ABNORMAL LOW (ref 6.5–8.1)

## 2023-06-03 LAB — HIV ANTIBODY (ROUTINE TESTING W REFLEX): HIV Screen 4th Generation wRfx: NONREACTIVE

## 2023-06-03 LAB — RETICULOCYTES
Immature Retic Fract: 20.3 % — ABNORMAL HIGH (ref 2.3–15.9)
RBC.: 2.98 MIL/uL — ABNORMAL LOW (ref 3.87–5.11)
Retic Count, Absolute: 32.2 10*3/uL (ref 19.0–186.0)
Retic Ct Pct: 1.1 % (ref 0.4–3.1)

## 2023-06-03 LAB — FERRITIN: Ferritin: 318 ng/mL — ABNORMAL HIGH (ref 11–307)

## 2023-06-03 LAB — SEDIMENTATION RATE: Sed Rate: 55 mm/h — ABNORMAL HIGH (ref 0–22)

## 2023-06-03 LAB — IRON AND TIBC
Iron: 16 ug/dL — ABNORMAL LOW (ref 28–170)
Saturation Ratios: 6 % — ABNORMAL LOW (ref 10.4–31.8)
TIBC: 249 ug/dL — ABNORMAL LOW (ref 250–450)
UIBC: 233 ug/dL

## 2023-06-03 LAB — VITAMIN B12: Vitamin B-12: 244 pg/mL (ref 180–914)

## 2023-06-03 LAB — TSH: TSH: 3.643 u[IU]/mL (ref 0.350–4.500)

## 2023-06-03 LAB — C-REACTIVE PROTEIN: CRP: 14.1 mg/dL — ABNORMAL HIGH (ref <1.0)

## 2023-06-03 LAB — BRAIN NATRIURETIC PEPTIDE: B Natriuretic Peptide: 89 pg/mL (ref 0.0–100.0)

## 2023-06-03 MED ORDER — IBUPROFEN 200 MG PO TABS
600.0000 mg | ORAL_TABLET | Freq: Once | ORAL | Status: AC
  Administered 2023-06-03: 600 mg via ORAL
  Filled 2023-06-03: qty 1

## 2023-06-03 MED ORDER — ACETAMINOPHEN 325 MG PO TABS
650.0000 mg | ORAL_TABLET | Freq: Four times a day (QID) | ORAL | Status: DC | PRN
  Administered 2023-06-04: 650 mg via ORAL
  Filled 2023-06-03: qty 2

## 2023-06-03 MED ORDER — SODIUM CHLORIDE 0.9 % IV SOLN: 500.0000 mg | Freq: Once | INTRAVENOUS | Status: DC

## 2023-06-03 MED ORDER — SODIUM CHLORIDE 0.9 % IV SOLN: 1.0000 g | Freq: Once | INTRAVENOUS | Status: DC

## 2023-06-03 MED ORDER — SODIUM CHLORIDE 0.9 % IV SOLN: INTRAVENOUS | Status: DC

## 2023-06-03 MED ORDER — COLCHICINE 0.6 MG PO TABS
0.6000 mg | ORAL_TABLET | Freq: Two times a day (BID) | ORAL | Status: DC
  Administered 2023-06-03 – 2023-06-06 (×7): 0.6 mg via ORAL
  Filled 2023-06-03 (×7): qty 1

## 2023-06-03 MED ORDER — ACETAMINOPHEN 650 MG RE SUPP: 650.0000 mg | Freq: Four times a day (QID) | RECTAL | Status: DC | PRN

## 2023-06-03 MED ORDER — PANTOPRAZOLE SODIUM 40 MG PO TBEC
40.0000 mg | DELAYED_RELEASE_TABLET | Freq: Every day | ORAL | Status: DC
Start: 2023-06-03 — End: 2023-06-06
  Administered 2023-06-03 – 2023-06-06 (×4): 40 mg via ORAL
  Filled 2023-06-03 (×4): qty 1

## 2023-06-03 MED ORDER — IBUPROFEN 600 MG PO TABS
600.0000 mg | ORAL_TABLET | Freq: Three times a day (TID) | ORAL | Status: DC
  Administered 2023-06-03 – 2023-06-05 (×5): 600 mg via ORAL
  Filled 2023-06-03 (×6): qty 1

## 2023-06-03 MED ORDER — INDOMETHACIN 25 MG PO CAPS
50.0000 mg | ORAL_CAPSULE | Freq: Three times a day (TID) | ORAL | Status: DC
Start: 2023-06-03 — End: 2023-06-03
  Filled 2023-06-03 (×2): qty 2

## 2023-06-03 NOTE — Progress Notes (Signed)
 Brief progress note: -68 year old female admitted earlier today. -Recent URI, and subsequent symptoms and findings suggestive of acute pericarditis with large pericardial effusion.  Elevated ESR, CRP, echo that revealed pericardial effusion. -Significantly, patient passed out twice. -Seen by cardiology earlier today.  Input is highly appreciated.  Ibuprofen  and colchicine  prescribed. -Echocardiogram revealed large pericardial effusion.  As per H&P done earlier today: Jeanette Bentley is a 68 y.o. female with medical history significant of anemia, hyperlipidemia seen at urgent care yesterday for headaches, bilateral ear pain/fullness, chest pain, and cough.  She was started on empiric treatment for bacterial URI with amoxicillin .  Patient presented to [facility] ED yesterday for evaluation of 2 episodes of exertional syncope at home. Vital signs on arrival to the ED: Temperature 98.9 F, pulse 112, respiratory rate 13, blood pressure 110/75, and SpO2 95% on room air.  Labs notable for WBC count 12.2, hemoglobin 10.1, MCV 79.8, platelet count 470k, glucose 115, creatinine 1.0, troponin negative x 2, COVID/influenza/RSV PCR negative.  CTA chest negative for PE but showing moderate pericardial effusion, small bilateral pleural effusions, and linear atelectasis or scarring in the lung bases.  CT head showing no acute intracranial abnormalities.  CT C-spine negative for acute fracture.  X-ray of right shoulder negative for acute fracture or dislocation.  X-ray of left hip/pelvis negative for acute or traumatic finding. ED physician discussed the case with cardiology fellow Dr. Otelia who did not feel strongly that patient had cardiac tamponade.  Recommended medical admission and formal echocardiogram in the morning.  Patient was transferred from [facility] to [facility] ED and [facility] called to admit.  Patient received morphine , Zofran , and 1 L normal saline in the ED.     Patient states she has not been feeling  well for the past week.  Having generalized weakness/fatigue, slight headaches, and chills.  It has been difficult for her to sleep at night because she has been experiencing pain in her chest whenever she lays down flat to sleep.  Yesterday she was climbing stairs at home when she felt very tired and dyspneic.  As soon as she reached upstairs, she passed out in the bathroom and hit the back of her head.  Then again later in the day she felt short of breath and tired as she was walking to her mailbox and passed out again and fell.  She is endorsing some discomfort in her right shoulder.  Denies cough, nausea, vomiting, abdominal pain, or diarrhea.  06/03/2023: Further management will depend on hospital course.

## 2023-06-03 NOTE — Progress Notes (Signed)
  Echocardiogram 2D Echocardiogram has been performed.  Ocie Doyne RDCS 06/03/2023, 2:32 PM

## 2023-06-03 NOTE — Progress Notes (Signed)
 68 year old female with hyperlipidemia, anemia, recent symptoms s/o URI, as well as chest pain.  2 days ago, patient also had 2 presyncope/syncope episodes while walking, with 3/5 consciousness.  No syncopal episodes since then.  Workup significant for CTA with no PE, but moderate pericardial fusion.  Personally reviewed and independently interpreted EKG and echocardiogram 06/03/2023.  EKG showed sinus tachycardia with diffuse ST elevation, PR elevation in aVR, V1.  Echocardiogram showed normal EF, mild MR, small to moderate circumferential pericardial effusion without any echocardiographic evidence of tamponade.  Acute pericarditis: Symptoms, EKG, echocardiogram, elevated ESR all suggestive of acute pericarditis. Idiopathic viral etiology most likely. Hemodynamically stable without any hypotension, also does not have tachycardia at this time. Heart sounds are clear, no Kussmaul sign. No clinical or echocardiographic evidence of tamponade. That said, I am concerned with her syncopal episodes 2 days ago.   I do think she warrants inpatient admission and further monitoring of her pericardial effusion, RV treat her acute pericarditis with anti-inflammatory agents. Recommend ibuprofen  600 mg 3 times daily scheduled, along with colchicine  0.6 mg twice daily.   Continue management for acute pericarditis as above. Limited echocardiogram tomorrow to reevaluate pericardial effusion.  In the setting of chronic anemia, now with pericardial air and pleural effusions, she may warrant with outpatient evaluation for lupus.  Newman JINNY Lawrence, MD

## 2023-06-03 NOTE — ED Provider Notes (Incomplete)
Patient presents in transfer from medical [facility].  She was seen there for 2 episodes of exertional syncope earlier today.  CTA of chest showed concern of pericardial effusion.  Case was discussed with cardiology recommended medical admission and echocardiogram in the morning.  Physical Exam  BP 134/80   Pulse 93   Temp 98.3 F (36.8 C) (Oral)   Resp (!) 29   SpO2 95%   Physical Exam Vitals and nursing note reviewed.  Constitutional:      General: She is not in acute distress.    Appearance: Normal appearance. She is well-developed. She is not ill-appearing, toxic-appearing or diaphoretic.  HENT:     Head: Normocephalic and atraumatic.     Right Ear: External ear normal.     Left Ear: External ear normal.     Nose: Nose normal.     Mouth/Throat:     Mouth: Mucous membranes are moist.  Eyes:     Extraocular Movements: Extraocular movements intact.     Conjunctiva/sclera: Conjunctivae normal.  Cardiovascular:     Rate and Rhythm: Normal rate and regular rhythm.     Heart sounds: No murmur heard.    No friction rub.  Pulmonary:     Effort: Pulmonary effort is normal. No respiratory distress.     Breath sounds: Normal breath sounds. No wheezing or rales.  Chest:     Chest wall: No tenderness.  Abdominal:     General: There is no distension.     Palpations: Abdomen is soft.     Tenderness: There is no abdominal tenderness.  Musculoskeletal:        General: No swelling. Normal range of motion.     Cervical back: Normal range of motion and neck supple.  Skin:    General: Skin is warm and dry.     Coloration: Skin is not jaundiced or pale.  Neurological:     General: No focal deficit present.     Mental Status: She is alert and oriented to person, place, and time.  Psychiatric:        Mood and Affect: Mood normal.        Behavior: Behavior normal.     Procedures  Procedures  ED Course / MDM    Medical Decision Making Amount and/or Complexity of Data  Reviewed Labs: ordered. Radiology: ordered.  Risk Prescription drug management.   On assessment, patient resting comfortably.  Vital signs notable for mild tachypnea.  She denies any current chest pain at this time.  She does state that she has had ongoing substernal chest pain for the past week.  This is worsened at night, when she lays flat.  Pericardial effusion, identified earlier today, possibly secondary to pericarditis.  Fortunately, cardiac enzymes today were negative.  Dose of ibuprofen was ordered.  Patient states that she did get started on antibiotics and antihistamines yesterday due to her recent cough and congestion.  Chest x-ray today was equivocal with bibasilar opacities.  She does have a leukocytosis.  Will continue antibiotics for now.  These were ordered.  Patient to be admitted for further management.

## 2023-06-03 NOTE — Consult Note (Signed)
 Cardiology Consultation   Patient ID: Jeanette Bentley MRN: [id number]; DOB: 26-Sep-1955  Admit date: 06/02/2023 Date of Consult: 06/03/2023  PCP:  [name], MD   [facility] Providers Cardiologist:  New  Patient Profile:   Jeanette Bentley is a 68 y.o. female with a hx of hyperlipidemia, anemia (hemoglobin C) who is being seen 06/03/2023 for the evaluation of pericardial effusion at the request of Dr Rosario.  History of Present Illness:   Jeanette Bentley is a 68 year old female with past medical history noted above.  Does not appear that she has ever been followed by cardiology.  She was seen at urgent care 2/2 with complaints of shortness of breath and chest pressure and diagnosed with URI, placed on amoxicillin .  Presented back to urgent care on 2/3 with no improvement in symptoms, concern raised for possible PE and was sent to the ED for further evaluation.   In the ED her labs showed sodium 136, potassium 3.8, creatinine 1.06, high-sensitivity troponin 11>> 17, WBC 12.2, hemoglobin 10.1, TSH 3.6, CRP 14.1, sed rate 55, albumin 2.9, AST 91, ALT 100, total protein 6. Respiratory panel negative. EKG showed sinus tachycardia, 113 bpm, nonspecific changes, poor R wave progression, PR depression.  Underwent CT angio which was negative for PE, noted moderate pericardial effusion, small bilateral pleural effusions.  CT head negative.  She was admitted to internal medicine for further management.  Cardiology asked to evaluate.  In talking with the patient she reports presenting to urgent care on Monday with complaints of chest pain/pressure as well as shortness of breath when she would lay down at night.  They noted fluid in her ears at this assessment and placed her on antibiotics.  Yesterday she reported walking up the stairs in her home and felt dizzy at the top of the staircase.  Walked into her daughter's bathroom and had a near syncopal episode, does not seem that she  completely lost consciousness.  Later that afternoon she was walking out to the mailbox and again felt dizzy and fell.  This is what prompted her to present back to urgent care at which time she was directed to the ED for further evaluation.  She currently lives at home and is independent, she does not experience any anginal symptoms or shortness of breath with her day-to-day activity.  Denies any history of tobacco use.  Denies any history of autoimmune disease.   Echocardiogram 2/4 LVEF of 60 to 65%, grade 1 diastolic dysfunction, normal RV, large pericardial effusion which is circumferential with no evidence of cardiac tamponade.  Right atrial pressure elevated with dilated IVC.  In the ED her blood pressures have been stable one pressure of systolic reading in the 90s.  She has been mainly in sinus rhythm, no significant tachycardia.  Past Medical History:  Diagnosis Date   Anemia    Hyperlipidemia     Past Surgical History:  Procedure Laterality Date   CESAREAN SECTION     WISDOM TOOTH EXTRACTION       Inpatient Medications: Scheduled Meds:  indomethacin   50 mg Oral TID WC   Continuous Infusions:  PRN Meds: acetaminophen  **OR** acetaminophen   Allergies:   No Known Allergies  Social History:   Social History   Socioeconomic History   Marital status: Married    Spouse name: Not on file   Number of children: Not on file   Years of education: Not on file   Highest education level: Not on file  Occupational  History   Not on file  Tobacco Use   Smoking status: Never   Smokeless tobacco: Not on file  Vaping Use   Vaping status: Never Used  Substance and Sexual Activity   Alcohol use: Yes   Drug use: No   Sexual activity: Yes    Birth control/protection: None  Other Topics Concern   Not on file  Social History Narrative   Not on file   Social Drivers of Health   Financial Resource Strain: Not on file  Food Insecurity: No Food Insecurity (06/03/2023)   Hunger  Vital Sign    Worried About Running Out of Food in the Last Year: Never true    Ran Out of Food in the Last Year: Never true  Transportation Needs: No Transportation Needs (06/03/2023)   PRAPARE - Administrator, Civil Service (Medical): No    Lack of Transportation (Non-Medical): No  Physical Activity: Not on file  Stress: Not on file  Social Connections: Moderately Integrated (06/03/2023)   Social Connection and Isolation Panel [NHANES]    Frequency of Communication with Friends and Family: Twice a week    Frequency of Social Gatherings with Friends and Family: Once a week    Attends Religious Services: More than 4 times per year    Active Member of [employer] or Organizations: No    Attends Banker Meetings: Never    Marital Status: Married  Catering Manager Violence: Not At Risk (06/03/2023)   Humiliation, Afraid, Rape, and Kick questionnaire    Fear of Current or Ex-Partner: No    Emotionally Abused: No    Physically Abused: No    Sexually Abused: No    Family History:    Family History  Problem Relation Age of Onset   Arthritis Mother    Asthma Mother    Ulcers Mother    Heart disease Father    Hypertension Father    Kidney disease Sister    Hypertension Brother    Diabetes Sister    Anxiety disorder Daughter      ROS:  Please see the history of present illness.   All other ROS reviewed and negative.     Physical Exam/Data:   Vitals:   06/03/23 0909 06/03/23 0945 06/03/23 1100 06/03/23 1200  BP:  115/64 105/73 108/78  Pulse:  79 80 74  Resp:  20 20 16   Temp:      TempSrc:      SpO2: 99% 97% 100% 97%  Weight:      Height:        Intake/Output Summary (Last 24 hours) at 06/03/2023 1427 Last data filed at 06/02/2023 2237 Gross per 24 hour  Intake 1000 ml  Output --  Net 1000 ml      06/03/2023    9:05 AM 10/13/2013    2:33 PM 09/15/2012    5:50 PM  Last 3 Weights  Weight (lbs) 180 lb 191 lb 6.4 oz 201 lb  Weight (kg) 81.647 kg 86.818 kg  91.173 kg     Body mass index is 29.95 kg/m.  General:  Well nourished, well developed, in no acute distress HEENT: normal Neck: no JVD Vascular: No carotid bruits; Distal pulses 2+ bilaterally Cardiac:  slightly distant S1, S2; RRR; no murmur  Lungs:  slightly diminished in bases Abd: soft, nontender, no hepatomegaly  Ext: no edema Musculoskeletal:  No deformities, BUE and BLE strength normal and equal Skin: warm and dry  Neuro:  CNs 2-12  intact, no focal abnormalities noted Psych:  Normal affect   EKG:  The EKG was personally reviewed and demonstrates:  Sinus tachycardia, 113 bpm, nonspecific changes, PR depression Telemetry:  Telemetry was personally reviewed and demonstrates:  Sinus rhythm  Relevant CV Studies:  Echo: 06/03/2023  IMPRESSIONS     1. Left ventricular ejection fraction, by estimation, is 60 to 65%. The  left ventricle has normal function. The left ventricle has no regional  wall motion abnormalities. Left ventricular diastolic parameters are  consistent with Grade I diastolic  dysfunction (impaired relaxation).   2. Right ventricular systolic function is normal. The right ventricular  size is normal.   3. Large pericardial effusion. The pericardial effusion is  circumferential. There is no evidence of cardiac tamponade.   4. The mitral valve is normal in structure. Trivial mitral valve  regurgitation. No evidence of mitral stenosis.   5. The aortic valve is tricuspid. Aortic valve regurgitation is trivial.   6. The inferior vena cava is dilated in size with <50% respiratory  variability, suggesting right atrial pressure of 15 mmHg.   Conclusion(s)/Recommendation(s): Although there is no echo current  evidence of tamponade, right atrial pressure is high (dilated IVC).  Recommend serial echo follow up.   FINDINGS   Left Ventricle: Left ventricular ejection fraction, by estimation, is 60  to 65%. The left ventricle has normal function. The left ventricle  has no  regional wall motion abnormalities. The left ventricular internal cavity  size was normal in size. There is   no left ventricular hypertrophy. Left ventricular diastolic parameters  are consistent with Grade I diastolic dysfunction (impaired relaxation).   Right Ventricle: The right ventricular size is normal. No increase in  right ventricular wall thickness. Right ventricular systolic function is  normal.   Left Atrium: Left atrial size was normal in size.   Right Atrium: Right atrial size was normal in size.   Pericardium: A large pericardial effusion is present. The pericardial  effusion is circumferential. There is no evidence of cardiac tamponade.   Mitral Valve: The mitral valve is normal in structure. Trivial mitral  valve regurgitation. No evidence of mitral valve stenosis. MV peak  gradient, 2.3 mmHg. The mean mitral valve gradient is 1.0 mmHg.   Tricuspid Valve: The tricuspid valve is normal in structure. Tricuspid  valve regurgitation is not demonstrated.   Aortic Valve: The aortic valve is tricuspid. Aortic valve regurgitation is  trivial. Aortic valve mean gradient measures 2.0 mmHg. Aortic valve peak  gradient measures 3.0 mmHg. Aortic valve area, by VTI measures 3.00 cm.   Pulmonic Valve: The pulmonic valve was grossly normal. Pulmonic valve  regurgitation is trivial. No evidence of pulmonic stenosis.   Aorta: The aortic root is normal in size and structure.   Venous: The inferior vena cava is dilated in size with less than 50%  respiratory variability, suggesting right atrial pressure of 15 mmHg.   IAS/Shunts: No atrial level shunt detected by color flow Doppler.    Laboratory Data:  High Sensitivity Troponin:   Recent Labs  Lab 06/02/23 1635 06/02/23 2049  TROPONINIHS 11 17     Chemistry Recent Labs  Lab 06/02/23 1635 06/03/23 0618  NA 136 140  K 3.8 3.8  CL 102 107  CO2 24 24  GLUCOSE 115* 105*  BUN 15 13  CREATININE 1.06* 1.18*   CALCIUM 9.2 8.6*  GFRNONAA 58* 51*  ANIONGAP 10 9    Recent Labs  Lab 06/03/23 0618  PROT  6.0*  ALBUMIN 2.9*  AST 91*  ALT 100*  ALKPHOS 86  BILITOT 0.5   Lipids No results for input(s): CHOL, TRIG, HDL, LABVLDL, LDLCALC, CHOLHDL in the last 168 hours.  Hematology Recent Labs  Lab 06/02/23 1635 06/03/23 0618  WBC 12.2* 9.4  RBC 3.52* 3.03*  2.98*  HGB 10.1* 8.7*  HCT 28.1* 24.6*  MCV 79.8* 81.2  MCH 28.7 28.7  MCHC 35.9 35.4  RDW 12.6 12.8  PLT 470* 387   Thyroid  Recent Labs  Lab 06/03/23 0618  TSH 3.643    BNP Recent Labs  Lab 06/03/23 0618  BNP 89.0    DDimer No results for input(s): DDIMER in the last 168 hours.   Radiology/Studies:  DG Hip Unilat W or Wo Pelvis 2-3 Views Left Result Date: 06/02/2023 CLINICAL DATA:  Left hip pain after a fall EXAM: DG HIP (WITH OR WITHOUT PELVIS) 2-3V LEFT COMPARISON:  None Available. FINDINGS: Mild bilateral osteoarthritis of the hips. No evidence of acute fracture or dislocation. IMPRESSION: No acute or traumatic finding. Mild bilateral osteoarthritis of the hips. Electronically Signed   By: Oneil Officer M.D.   On: 06/02/2023 21:52   DG Shoulder Right Result Date: 06/02/2023 CLINICAL DATA:  Right shoulder pain. Generalized weakness with 2 falls today. EXAM: RIGHT SHOULDER - 2+ VIEW COMPARISON:  None Available. FINDINGS: Degenerative changes in the acromioclavicular and glenohumeral joints with prominent subacromial and humeral osteophyte formation. Bone spurs demonstrate near bone on bone appearance at the subacromial space. No evidence of acute fracture or dislocation. No focal bone lesion or bone destruction. Soft tissues are unremarkable. IMPRESSION: Prominent degenerative changes in the right shoulder. No acute displaced fractures identified. Electronically Signed   By: Elsie Gravely M.D.   On: 06/02/2023 21:52   CT Angio Chest PE W and/or Wo Contrast Result Date: 06/02/2023 CLINICAL DATA:  Pulmonary  embolus suspected with high probability. Generalized weakness and falls today. Shortness of breath and chest pressure. Recent diagnosis of upper respiratory infection on amoxicillin . EXAM: CT ANGIOGRAPHY CHEST WITH CONTRAST TECHNIQUE: Multidetector CT imaging of the chest was performed using the standard protocol during bolus administration of intravenous contrast. Multiplanar CT image reconstructions and MIPs were obtained to evaluate the vascular anatomy. RADIATION DOSE REDUCTION: This exam was performed according to the departmental dose-optimization program which includes automated exposure control, adjustment of the mA and/or kV according to patient size and/or use of iterative reconstruction technique. CONTRAST:  75mL OMNIPAQUE  IOHEXOL  350 MG/ML SOLN COMPARISON:  Chest radiograph 06/02/2023 FINDINGS: Cardiovascular: Technically adequate study with good opacification of the central and segmental pulmonary arteries. Mild motion and streak artifact. No focal filling defects. No evidence of significant pulmonary embolus. Normal heart size. Moderate pericardial effusion. Normal caliber thoracic aorta. No aortic dissection. Great vessel origins are patent. Mediastinum/Nodes: No enlarged mediastinal, hilar, or axillary lymph nodes. Thyroid gland, trachea, and esophagus demonstrate no significant findings. Lungs/Pleura: Small bilateral pleural effusions. Bandlike opacities in the lung bases likely represent scarring or atelectasis. No airspace disease or consolidation. Upper Abdomen: No acute abnormalities. Musculoskeletal: Degenerative changes in the spine. No acute bony abnormalities. Review of the MIP images confirms the above findings. IMPRESSION: 1. No evidence of significant pulmonary embolus. 2. Moderate pericardial effusion. 3. Small bilateral pleural effusions. Linear atelectasis or scarring in the lung bases. Electronically Signed   By: Elsie Gravely M.D.   On: 06/02/2023 21:36   CT Cervical Spine Wo  Contrast Result Date: 06/02/2023 CLINICAL DATA:  Neck trauma. Generalized weakness with  2 falls today. EXAM: CT CERVICAL SPINE WITHOUT CONTRAST TECHNIQUE: Multidetector CT imaging of the cervical spine was performed without intravenous contrast. Multiplanar CT image reconstructions were also generated. RADIATION DOSE REDUCTION: This exam was performed according to the departmental dose-optimization program which includes automated exposure control, adjustment of the mA and/or kV according to patient size and/or use of iterative reconstruction technique. COMPARISON:  Cervical spine radiographs 09/15/2012 FINDINGS: Alignment: Normal alignment. Skull base and vertebrae: No vertebral compression deformities. No focal bone lesion or bone destruction. Bone cortex appears intact. Skull base appears intact. Soft tissues and spinal canal: No prevertebral soft tissue swelling. No abnormal paraspinal soft tissue mass or infiltration. Disc levels: Degenerative changes with disc space narrowing and endplate osteophyte formation throughout the cervical spine. Degenerative changes in the facet joints. Upper chest: Lung apices are clear. Other: None. IMPRESSION: Normal alignment of the cervical spine. Mild degenerative changes. No acute displaced fractures identified. Electronically Signed   By: Elsie Gravely M.D.   On: 06/02/2023 21:32   CT Head Wo Contrast Result Date: 06/02/2023 CLINICAL DATA:  Minor head trauma. Generalized weakness and falls x2 today. EXAM: CT HEAD WITHOUT CONTRAST TECHNIQUE: Contiguous axial images were obtained from the base of the skull through the vertex without intravenous contrast. RADIATION DOSE REDUCTION: This exam was performed according to the departmental dose-optimization program which includes automated exposure control, adjustment of the mA and/or kV according to patient size and/or use of iterative reconstruction technique. COMPARISON:  None Available. FINDINGS: Brain: No evidence of acute  infarction, hemorrhage, hydrocephalus, extra-axial collection or mass lesion/mass effect. Vascular: No hyperdense vessel or unexpected calcification. Skull: Normal. Negative for fracture or focal lesion. Sinuses/Orbits: Paranasal sinuses and mastoid air cells are clear. Other: None. IMPRESSION: No acute intracranial abnormalities. Electronically Signed   By: Elsie Gravely M.D.   On: 06/02/2023 21:29   DG Chest 2 View Result Date: 06/02/2023 CLINICAL DATA:  Upper respiratory infection. EXAM: CHEST - 2 VIEW COMPARISON:  None Available. FINDINGS: There are bibasilar streaky atelectasis or scarring. Pneumonia is not excluded. Blunting of the costophrenic angles may be scarring or represent trace pleural effusion. No pneumothorax. The cardiac silhouette is within normal limits. Atherosclerotic calcification of the aorta. No acute osseous pathology. IMPRESSION: Bibasilar atelectasis or scarring. Pneumonia is not excluded. Electronically Signed   By: Vanetta Chou M.D.   On: 06/02/2023 18:24     Assessment and Plan:   JAYMI TINNER is a 69 y.o. female with a hx of hyperlipidemia, anemia who is being seen 06/03/2023 for the evaluation of pericardial effusion at the request of Dr Rosario.  Pericardial effusion Pericarditis -- presented to UC with complaints of chest pressure/heaviness and shortness of breath while lying down at night. Initially Dx with URI -- had two episodes with near syncope/possible syncope yesterday -- CT angio negative for PE but noted small bilateral pleural effusions, moderate pericardial effusion -- EKG showed sinus tachycardia, with PR depression -- Echo with LVEF of 60-65%, g1DD, large pericardial effusion with no evidence of tamponade, high right atrial pressure/dilated IVC. Reviewed with MD, no plans for pericardiocentesis at this time given hemodynamically stable -- CRP 14, Sed rate 55 -- start ibuprofen  600mg  TID, colchicine  0.6mg  BID -- start NS 50cc/hr --  recheck limited echo in the morning  Anemia/hx of hemoglobin C -- denies any known hx of autoimmune disorders -- Hgb 10.1>>8.7, denies any bleeding -- consider outpatient work up  HLD -- reports she was on statin in the past, but stopped.  Uses herbal medicines  Elevated LFTs -- AST 91, ALT 100, no evidence of shock -- follow  For questions or updates, please contact [facility] Please consult [email or web address] for contact info under    Signed, Manuelita Rummer, NP  06/03/2023 2:27 PM

## 2023-06-03 NOTE — H&P (Addendum)
 History and Physical    Jeanette Bentley FMW:[id number] DOB: 01/06/1956 DOA: 06/02/2023  PCP: [name], MD  Patient coming from: [facility] ED  Chief Complaint: Syncope  HPI: Jeanette Bentley is a 68 y.o. female with medical history significant of anemia, hyperlipidemia seen at urgent care yesterday for headaches, bilateral ear pain/fullness, chest pain, and cough.  She was started on empiric treatment for bacterial URI with amoxicillin .  Patient presented to [facility] ED yesterday for evaluation of 2 episodes of exertional syncope at home. Vital signs on arrival to the ED: Temperature 98.9 F, pulse 112, respiratory rate 13, blood pressure 110/75, and SpO2 95% on room air.  Labs notable for WBC count 12.2, hemoglobin 10.1, MCV 79.8, platelet count 470k, glucose 115, creatinine 1.0, troponin negative x 2, COVID/influenza/RSV PCR negative.  CTA chest negative for PE but showing moderate pericardial effusion, small bilateral pleural effusions, and linear atelectasis or scarring in the lung bases.  CT head showing no acute intracranial abnormalities.  CT C-spine negative for acute fracture.  X-ray of right shoulder negative for acute fracture or dislocation.  X-ray of left hip/pelvis negative for acute or traumatic finding. ED physician discussed the case with cardiology fellow Dr. Otelia who did not feel strongly that patient had cardiac tamponade.  Recommended medical admission and formal echocardiogram in the morning.  Patient was transferred [facility] to [facility] ED and [facility] called to admit.  Patient received morphine , Zofran , and 1 L normal saline in the ED.    Patient states she has not been feeling well for the past week.  Having generalized weakness/fatigue, slight headaches, and chills.  It has been difficult for her to sleep at night because she has been experiencing pain in her chest whenever she lays down flat to sleep.  Yesterday she was climbing stairs at home when she felt very  tired and dyspneic.  As soon as she reached upstairs, she passed out in the bathroom and hit the back of her head.  Then again later in the day she felt short of breath and tired as she was walking to her mailbox and passed out again and fell.  She is endorsing some discomfort in her right shoulder.  Denies cough, nausea, vomiting, abdominal pain, or diarrhea.  Review of Systems:  Review of Systems  All other systems reviewed and are negative.   Past Medical History:  Diagnosis Date   Anemia    Hyperlipidemia     Past Surgical History:  Procedure Laterality Date   CESAREAN SECTION     WISDOM TOOTH EXTRACTION       reports that she has never smoked. She does not have any smokeless tobacco history on file. She reports current alcohol use. She reports that she does not use drugs.  No Known Allergies  Family History  Problem Relation Age of Onset   Arthritis Mother    Asthma Mother    Ulcers Mother    Heart disease Father    Hypertension Father    Kidney disease Sister    Hypertension Brother    Diabetes Sister    Anxiety disorder Daughter     Prior to Admission medications   Medication Sig Start Date End Date Taking? Authorizing Provider  amoxicillin  (AMOXIL ) 875 MG tablet Take 1 tablet (875 mg total) by mouth 2 (two) times daily. 06/01/23  Yes Christopher Savannah, PA-C  Bioflavonoid Products (ESTER-C) TABS 1000 MG 2 tablets Orally once a day   Yes [provider]  carbamide peroxide (DEBROX) 6.5 % OTIC solution Place 5 drops into both ears daily as needed. 05/12/23  Yes Christopher Savannah, PA-C  cetirizine  (ZYRTEC  ALLERGY) 10 MG tablet Take 1 tablet (10 mg total) by mouth daily. 06/01/23  Yes Christopher Savannah, PA-C  Cholecalciferol (VITAMIN D-3) 125 MCG (5000 UT) TABS 1 tablet   Yes [provider]  Fluocinolone  Acetonide (DERMOTIC ) 0.01 % OIL Place 5 drops in ear(s) 2 (two) times daily as needed (Itching in ears). 11/28/22  Yes Joesph Shaver Scales, PA-C  ibuprofen  (ADVIL ) 200 MG  tablet Take 200 mg by mouth every 6 (six) hours as needed.   Yes [provider]  Anselm Frizzle 1000 MG CAPS 2 capsules   Yes [provider]  Probiotic CHEW as directed Orally   Yes [provider]  Red Yeast Rice 600 MG CAPS 4 capsules   Yes [provider]  pseudoephedrine  (SUDAFED) 30 MG tablet Take 1 tablet (30 mg total) by mouth every 8 (eight) hours as needed for congestion. 06/01/23   Christopher Savannah, PA-C  saccharomyces boulardii (FLORASTOR) 250 MG capsule 1 capsule Orally once a day    [provider]  WHOLE GRAIN BARLEY,DIAGNOSTIC, IJ Inject as directed.    [provider]    Physical Exam: Vitals:   06/02/23 2215 06/02/23 2230 06/02/23 2245 06/02/23 2341  BP: 133/83 128/81 134/80 114/76  Pulse: 92 94 93 98  Resp: (!) 28 (!) 31 (!) 29 (!) 21  Temp:  98.3 F (36.8 C)  99 F (37.2 C)  TempSrc:  Oral  Oral  SpO2: 96% 95% 95% 100%    Physical Exam Vitals reviewed.  Constitutional:      General: She is not in acute distress. HENT:     Head: Normocephalic and atraumatic.  Eyes:     Extraocular Movements: Extraocular movements intact.  Cardiovascular:     Rate and Rhythm: Normal rate and regular rhythm.     Pulses: Normal pulses.  Pulmonary:     Effort: Pulmonary effort is normal. No respiratory distress.     Breath sounds: Normal breath sounds. No wheezing or rales.  Abdominal:     General: Bowel sounds are normal. There is no distension.     Palpations: Abdomen is soft.     Tenderness: There is no abdominal tenderness. There is no guarding.  Musculoskeletal:     Cervical back: Normal range of motion.     Right lower leg: No edema.     Left lower leg: No edema.  Skin:    General: Skin is warm and dry.  Neurological:     General: No focal deficit present.     Mental Status: She is alert and oriented to person, place, and time.     Labs on Admission: I have personally reviewed following labs and imaging  studies  CBC: Recent Labs  Lab 06/02/23 1635  WBC 12.2*  HGB 10.1*  HCT 28.1*  MCV 79.8*  PLT 470*   Basic Metabolic Panel: Recent Labs  Lab 06/02/23 1635  NA 136  K 3.8  CL 102  CO2 24  GLUCOSE 115*  BUN 15  CREATININE 1.06*  CALCIUM 9.2   GFR: CrCl cannot be calculated (Unknown ideal weight.). Liver Function Tests: No results for input(s): AST, ALT, ALKPHOS, BILITOT, PROT, ALBUMIN in the last 168 hours. No results for input(s): LIPASE, AMYLASE in the last 168 hours. No results for input(s): AMMONIA in the last 168 hours. Coagulation Profile: No results for  input(s): INR, PROTIME in the last 168 hours. Cardiac Enzymes: No results for input(s): CKTOTAL, CKMB, CKMBINDEX, TROPONINI in the last 168 hours. BNP (last 3 results) No results for input(s): PROBNP in the last 8760 hours. HbA1C: No results for input(s): HGBA1C in the last 72 hours. CBG: No results for input(s): GLUCAP in the last 168 hours. Lipid Profile: No results for input(s): CHOL, HDL, LDLCALC, TRIG, CHOLHDL, LDLDIRECT in the last 72 hours. Thyroid Function Tests: No results for input(s): TSH, T4TOTAL, FREET4, T3FREE, THYROIDAB in the last 72 hours. Anemia Panel: No results for input(s): VITAMINB12, FOLATE, FERRITIN, TIBC, IRON, RETICCTPCT in the last 72 hours. Urine analysis: No results found for: COLORURINE, APPEARANCEUR, LABSPEC, PHURINE, GLUCOSEU, HGBUR, BILIRUBINUR, KETONESUR, PROTEINUR, UROBILINOGEN, NITRITE, LEUKOCYTESUR  Radiological Exams on Admission: DG Hip Unilat W or Wo Pelvis 2-3 Views Left Result Date: 06/02/2023 CLINICAL DATA:  Left hip pain after a fall EXAM: DG HIP (WITH OR WITHOUT PELVIS) 2-3V LEFT COMPARISON:  None Available. FINDINGS: Mild bilateral osteoarthritis of the hips. No evidence of acute fracture or dislocation. IMPRESSION: No acute or traumatic finding. Mild bilateral  osteoarthritis of the hips. Electronically Signed   By: Oneil Officer M.D.   On: 06/02/2023 21:52   DG Shoulder Right Result Date: 06/02/2023 CLINICAL DATA:  Right shoulder pain. Generalized weakness with 2 falls today. EXAM: RIGHT SHOULDER - 2+ VIEW COMPARISON:  None Available. FINDINGS: Degenerative changes in the acromioclavicular and glenohumeral joints with prominent subacromial and humeral osteophyte formation. Bone spurs demonstrate near bone on bone appearance at the subacromial space. No evidence of acute fracture or dislocation. No focal bone lesion or bone destruction. Soft tissues are unremarkable. IMPRESSION: Prominent degenerative changes in the right shoulder. No acute displaced fractures identified. Electronically Signed   By: Elsie Gravely M.D.   On: 06/02/2023 21:52   CT Angio Chest PE W and/or Wo Contrast Result Date: 06/02/2023 CLINICAL DATA:  Pulmonary embolus suspected with high probability. Generalized weakness and falls today. Shortness of breath and chest pressure. Recent diagnosis of upper respiratory infection on amoxicillin . EXAM: CT ANGIOGRAPHY CHEST WITH CONTRAST TECHNIQUE: Multidetector CT imaging of the chest was performed using the standard protocol during bolus administration of intravenous contrast. Multiplanar CT image reconstructions and MIPs were obtained to evaluate the vascular anatomy. RADIATION DOSE REDUCTION: This exam was performed according to the departmental dose-optimization program which includes automated exposure control, adjustment of the mA and/or kV according to patient size and/or use of iterative reconstruction technique. CONTRAST:  75mL OMNIPAQUE  IOHEXOL  350 MG/ML SOLN COMPARISON:  Chest radiograph 06/02/2023 FINDINGS: Cardiovascular: Technically adequate study with good opacification of the central and segmental pulmonary arteries. Mild motion and streak artifact. No focal filling defects. No evidence of significant pulmonary embolus. Normal heart size.  Moderate pericardial effusion. Normal caliber thoracic aorta. No aortic dissection. Great vessel origins are patent. Mediastinum/Nodes: No enlarged mediastinal, hilar, or axillary lymph nodes. Thyroid gland, trachea, and esophagus demonstrate no significant findings. Lungs/Pleura: Small bilateral pleural effusions. Bandlike opacities in the lung bases likely represent scarring or atelectasis. No airspace disease or consolidation. Upper Abdomen: No acute abnormalities. Musculoskeletal: Degenerative changes in the spine. No acute bony abnormalities. Review of the MIP images confirms the above findings. IMPRESSION: 1. No evidence of significant pulmonary embolus. 2. Moderate pericardial effusion. 3. Small bilateral pleural effusions. Linear atelectasis or scarring in the lung bases. Electronically Signed   By: Elsie Gravely M.D.   On: 06/02/2023 21:36   CT Cervical Spine Wo Contrast Result Date: 06/02/2023 CLINICAL  DATA:  Neck trauma. Generalized weakness with 2 falls today. EXAM: CT CERVICAL SPINE WITHOUT CONTRAST TECHNIQUE: Multidetector CT imaging of the cervical spine was performed without intravenous contrast. Multiplanar CT image reconstructions were also generated. RADIATION DOSE REDUCTION: This exam was performed according to the departmental dose-optimization program which includes automated exposure control, adjustment of the mA and/or kV according to patient size and/or use of iterative reconstruction technique. COMPARISON:  Cervical spine radiographs 09/15/2012 FINDINGS: Alignment: Normal alignment. Skull base and vertebrae: No vertebral compression deformities. No focal bone lesion or bone destruction. Bone cortex appears intact. Skull base appears intact. Soft tissues and spinal canal: No prevertebral soft tissue swelling. No abnormal paraspinal soft tissue mass or infiltration. Disc levels: Degenerative changes with disc space narrowing and endplate osteophyte formation throughout the cervical  spine. Degenerative changes in the facet joints. Upper chest: Lung apices are clear. Other: None. IMPRESSION: Normal alignment of the cervical spine. Mild degenerative changes. No acute displaced fractures identified. Electronically Signed   By: Elsie Gravely M.D.   On: 06/02/2023 21:32   CT Head Wo Contrast Result Date: 06/02/2023 CLINICAL DATA:  Minor head trauma. Generalized weakness and falls x2 today. EXAM: CT HEAD WITHOUT CONTRAST TECHNIQUE: Contiguous axial images were obtained from the base of the skull through the vertex without intravenous contrast. RADIATION DOSE REDUCTION: This exam was performed according to the departmental dose-optimization program which includes automated exposure control, adjustment of the mA and/or kV according to patient size and/or use of iterative reconstruction technique. COMPARISON:  None Available. FINDINGS: Brain: No evidence of acute infarction, hemorrhage, hydrocephalus, extra-axial collection or mass lesion/mass effect. Vascular: No hyperdense vessel or unexpected calcification. Skull: Normal. Negative for fracture or focal lesion. Sinuses/Orbits: Paranasal sinuses and mastoid air cells are clear. Other: None. IMPRESSION: No acute intracranial abnormalities. Electronically Signed   By: Elsie Gravely M.D.   On: 06/02/2023 21:29   DG Chest 2 View Result Date: 06/02/2023 CLINICAL DATA:  Upper respiratory infection. EXAM: CHEST - 2 VIEW COMPARISON:  None Available. FINDINGS: There are bibasilar streaky atelectasis or scarring. Pneumonia is not excluded. Blunting of the costophrenic angles may be scarring or represent trace pleural effusion. No pneumothorax. The cardiac silhouette is within normal limits. Atherosclerotic calcification of the aorta. No acute osseous pathology. IMPRESSION: Bibasilar atelectasis or scarring. Pneumonia is not excluded. Electronically Signed   By: Vanetta Chou M.D.   On: 06/02/2023 18:24    EKG: Independently reviewed. Sinus  tachycardia, borderline T wave abnormalities, baseline wander. No previous EKG for comparison.   Assessment and Plan  Chest pain and pericardial effusion ?Acute pericarditis.  Chest pain is worse when laying flat.  Although no obvious pericardial friction rub or acute EKG changes consistent with pericarditis seen.  Patient had 2 episodes of exertional syncope yesterday.  Troponin negative x 2.  CTA chest negative for PE but showing moderate pericardial effusion.  No JVD, hypotension, or muffled heart sounds.  EDP discussed with on-call cardiologist who felt that cardiac tamponade was less likely.  Cardiology recommended formal echocardiogram in the morning which has been ordered.  Will check TSH, BNP, ESR, and CRP.  Small bilateral pleural effusions Seen on CT.  Patient is not hypoxic.  Checking BNP.  Anemia Hemoglobin 10.1, MCV 79.8.  No previous labs for comparison.  Patient is not endorsing any symptoms of GI bleeding or any other bleeding.  Anemia panel and FOBT ordered.  Repeat CBC in a.m.  DVT prophylaxis: SCDs Code Status: Full Code (discussed with the  patient) Family Communication: Husband at bedside. Level of care: Progressive Care Unit Admission status: It is my clinical opinion that referral for OBSERVATION is reasonable and necessary in this patient based on the above information provided. The aforementioned taken together are felt to place the patient at high risk for further clinical deterioration. However, it is anticipated that the patient may be medically stable for discharge from the hospital within 24 to 48 hours.  Editha Ram MD [facility]  If 7PM-7AM, please contact night-coverage [email or web address]  06/03/2023, 12:34 AM

## 2023-06-04 ENCOUNTER — Observation Stay (HOSPITAL_COMMUNITY): Payer: Medicare Other

## 2023-06-04 DIAGNOSIS — D649 Anemia, unspecified: Secondary | ICD-10-CM

## 2023-06-04 DIAGNOSIS — R079 Chest pain, unspecified: Secondary | ICD-10-CM | POA: Diagnosis not present

## 2023-06-04 DIAGNOSIS — I300 Acute nonspecific idiopathic pericarditis: Secondary | ICD-10-CM | POA: Diagnosis not present

## 2023-06-04 DIAGNOSIS — R000 Tachycardia, unspecified: Secondary | ICD-10-CM | POA: Diagnosis present

## 2023-06-04 DIAGNOSIS — Z8249 Family history of ischemic heart disease and other diseases of the circulatory system: Secondary | ICD-10-CM | POA: Diagnosis not present

## 2023-06-04 DIAGNOSIS — E785 Hyperlipidemia, unspecified: Secondary | ICD-10-CM | POA: Diagnosis present

## 2023-06-04 DIAGNOSIS — I309 Acute pericarditis, unspecified: Secondary | ICD-10-CM | POA: Diagnosis present

## 2023-06-04 DIAGNOSIS — I3139 Other pericardial effusion (noninflammatory): Secondary | ICD-10-CM

## 2023-06-04 DIAGNOSIS — Z1152 Encounter for screening for COVID-19: Secondary | ICD-10-CM | POA: Diagnosis not present

## 2023-06-04 DIAGNOSIS — G8929 Other chronic pain: Secondary | ICD-10-CM | POA: Diagnosis present

## 2023-06-04 DIAGNOSIS — B349 Viral infection, unspecified: Secondary | ICD-10-CM | POA: Diagnosis present

## 2023-06-04 DIAGNOSIS — R55 Syncope and collapse: Secondary | ICD-10-CM

## 2023-06-04 DIAGNOSIS — Z79899 Other long term (current) drug therapy: Secondary | ICD-10-CM | POA: Diagnosis not present

## 2023-06-04 DIAGNOSIS — Z833 Family history of diabetes mellitus: Secondary | ICD-10-CM | POA: Diagnosis not present

## 2023-06-04 DIAGNOSIS — J90 Pleural effusion, not elsewhere classified: Secondary | ICD-10-CM | POA: Diagnosis present

## 2023-06-04 DIAGNOSIS — Z841 Family history of disorders of kidney and ureter: Secondary | ICD-10-CM | POA: Diagnosis not present

## 2023-06-04 DIAGNOSIS — Z825 Family history of asthma and other chronic lower respiratory diseases: Secondary | ICD-10-CM | POA: Diagnosis not present

## 2023-06-04 DIAGNOSIS — Z6829 Body mass index (BMI) 29.0-29.9, adult: Secondary | ICD-10-CM | POA: Diagnosis not present

## 2023-06-04 DIAGNOSIS — N179 Acute kidney failure, unspecified: Secondary | ICD-10-CM | POA: Diagnosis present

## 2023-06-04 DIAGNOSIS — Z818 Family history of other mental and behavioral disorders: Secondary | ICD-10-CM | POA: Diagnosis not present

## 2023-06-04 DIAGNOSIS — R7989 Other specified abnormal findings of blood chemistry: Secondary | ICD-10-CM | POA: Diagnosis present

## 2023-06-04 DIAGNOSIS — J069 Acute upper respiratory infection, unspecified: Secondary | ICD-10-CM | POA: Diagnosis present

## 2023-06-04 DIAGNOSIS — Z8261 Family history of arthritis: Secondary | ICD-10-CM | POA: Diagnosis not present

## 2023-06-04 DIAGNOSIS — R700 Elevated erythrocyte sedimentation rate: Secondary | ICD-10-CM | POA: Diagnosis present

## 2023-06-04 DIAGNOSIS — E663 Overweight: Secondary | ICD-10-CM | POA: Diagnosis present

## 2023-06-04 DIAGNOSIS — M25511 Pain in right shoulder: Secondary | ICD-10-CM | POA: Diagnosis present

## 2023-06-04 LAB — CBC
HCT: 23.3 % — ABNORMAL LOW (ref 36.0–46.0)
Hemoglobin: 8.3 g/dL — ABNORMAL LOW (ref 12.0–15.0)
MCH: 28.8 pg (ref 26.0–34.0)
MCHC: 35.6 g/dL (ref 30.0–36.0)
MCV: 80.9 fL (ref 80.0–100.0)
Platelets: 383 10*3/uL (ref 150–400)
RBC: 2.88 MIL/uL — ABNORMAL LOW (ref 3.87–5.11)
RDW: 13.1 % (ref 11.5–15.5)
WBC: 8.4 10*3/uL (ref 4.0–10.5)
nRBC: 0 % (ref 0.0–0.2)

## 2023-06-04 LAB — ECHOCARDIOGRAM LIMITED
Height: 65 in
S' Lateral: 2.5 cm
Weight: 2880 [oz_av]

## 2023-06-04 LAB — COMPREHENSIVE METABOLIC PANEL
ALT: 150 U/L — ABNORMAL HIGH (ref 0–44)
AST: 206 U/L — ABNORMAL HIGH (ref 15–41)
Albumin: 2.8 g/dL — ABNORMAL LOW (ref 3.5–5.0)
Alkaline Phosphatase: 127 U/L — ABNORMAL HIGH (ref 38–126)
Anion gap: 8 (ref 5–15)
BUN: 20 mg/dL (ref 8–23)
CO2: 24 mmol/L (ref 22–32)
Calcium: 8.6 mg/dL — ABNORMAL LOW (ref 8.9–10.3)
Chloride: 109 mmol/L (ref 98–111)
Creatinine, Ser: 1.34 mg/dL — ABNORMAL HIGH (ref 0.44–1.00)
GFR, Estimated: 44 mL/min — ABNORMAL LOW (ref >60)
Glucose, Bld: 112 mg/dL — ABNORMAL HIGH (ref 70–99)
Potassium: 4.1 mmol/L (ref 3.5–5.1)
Sodium: 141 mmol/L (ref 135–145)
Total Bilirubin: 0.6 mg/dL (ref 0.0–1.2)
Total Protein: 6.1 g/dL — ABNORMAL LOW (ref 6.5–8.1)

## 2023-06-04 MED ORDER — DICLOFENAC SODIUM 1 % EX GEL
2.0000 g | Freq: Four times a day (QID) | CUTANEOUS | Status: DC
  Administered 2023-06-04 – 2023-06-06 (×6): 2 g via TOPICAL
  Filled 2023-06-04 (×2): qty 100

## 2023-06-04 MED ORDER — VITAMIN B-12 1000 MCG PO TABS
1000.0000 ug | ORAL_TABLET | Freq: Every day | ORAL | Status: DC
  Administered 2023-06-04 – 2023-06-06 (×3): 1000 ug via ORAL
  Filled 2023-06-04 (×3): qty 1

## 2023-06-04 NOTE — Plan of Care (Signed)

## 2023-06-04 NOTE — Progress Notes (Addendum)
   Patient Name: Jeanette Bentley Date of Encounter: 06/04/2023 [facility] Cardiologist: None   Interval Summary  .    Chest pain resolved She has noticed some stomach irritation Has right shoulder pain, worse than baseline since a fall.  Vital Signs .    Vitals:   06/04/23 0739 06/04/23 0800 06/04/23 0807 06/04/23 0808  BP:  116/79    Pulse:  94    Resp:  (!) 27    Temp: 98.8 F (37.1 C)   97.9 F (36.6 C)  TempSrc: Oral     SpO2:  96% 100%   Weight:      Height:       No intake or output data in the 24 hours ending 06/04/23 0905    06/03/2023    9:05 AM 10/13/2013    2:33 PM 09/15/2012    5:50 PM  Last 3 Weights  Weight (lbs) 180 lb 191 lb 6.4 oz 201 lb  Weight (kg) 81.647 kg 86.818 kg 91.173 kg      Telemetry/ECG    No arrhythmia  Physical Exam .   Physical Exam Vitals and nursing note reviewed.  Constitutional:      General: She is not in acute distress. Neck:     Vascular: No JVD.  Cardiovascular:     Rate and Rhythm: Normal rate and regular rhythm.     Heart sounds: Normal heart sounds. No murmur heard. Pulmonary:     Effort: Pulmonary effort is normal.     Breath sounds: Normal breath sounds. No wheezing or rales.  Musculoskeletal:     Right lower leg: No edema.     Left lower leg: No edema.      Assessment & Plan .     68 year old female with hyperlipidemia, anemia, recent URI, acute pericarditis with pericardial effusion   Acute pericarditis: Symptoms, EKG, echocardiogram, elevated ESR/CRP all suggestive of acute pericarditis. Idiopathic viral etiology most likely. Small to moderate circumferential pericardial effusion. Hemodynamically stable. No echocardiographic or clinical signs of tamponade.   Started on ibuprofen  600 mg 3 times daily with PPI, along with colchicine  0.6 mg twice daily.   Chest pain is resolved.  Stomach irritation likely due to ibuprofen .  Emphasize taking a ibuprofen  with food, and taking PPI. Check CBC  today.  She has underlying anemia with a drop in hemoglobin yesterday, even before starting ibuprofen . Will repeat limited echocardiogram later today.  Will arrange outpatient 2-week Zio patch monitor to rule out any arrhythmias, given her syncope. Will arrange outpatient echocardiogram and follow-up in next 1-2 weeks.  In the setting of chronic anemia, elevated LFTs, Cr, pericardial and pleural effusions, she may warrant with outpatient evaluation for lupus.   With constellation of above signs/symptoms, it may be prudent to monitor her for one more day.  AKI, LFTs: Trending up. May need further monitoring. Continue hydration.   Right shoulder pain: Chronic degenerative changes, no fracture or dislocation. I reckon ibuprofen  will help this pain in the short-term. Rest of the management as per primary team.   For questions or updates, please contact [facility] Please consult [email or web address] for contact info under        Signed, Newman JINNY Lawrence, MD

## 2023-06-04 NOTE — Care Management Obs Status (Signed)
MEDICARE OBSERVATION STATUS NOTIFICATION   Patient Details  Name: Jeanette Bentley MRN: [id number] Date of Birth: 04/22/1956   Medicare Observation Status Notification Given:  Yes    Oletta Cohn, RN 06/04/2023, 8:02 AM

## 2023-06-04 NOTE — ED Notes (Signed)
 Pt transported to 2C14.

## 2023-06-04 NOTE — Progress Notes (Signed)
 Transition of Care [facility]) - Inpatient Brief Assessment   Patient Details  Name: Jeanette Bentley MRN: [id number] Date of Birth: 05/24/1955  [facility] of [facility]) CM/SW Contact:    Debarah Saunas, RN Phone Number: 06/04/2023, 7:59 AM   Clinical Narrative: RNCM met with pt at bedside regarding discharge plan.  Pt states she has support at home and does ADLs independently.  Plan is to return home with self care.   Transition of Care Asessment: Insurance and Status: (P) Insurance coverage has been reviewed Patient has primary care physician: (P) Yes Home environment has been reviewed: (P) from home with spouse and daughter Prior level of function:: (P) independent Prior/Current Home Services: (P) No current home services Social Drivers of Health Review: (P) SDOH reviewed no interventions necessary Readmission risk has been reviewed: (P) Yes Transition of care needs: (P) no transition of care needs at this time

## 2023-06-04 NOTE — Progress Notes (Addendum)
 PROGRESS NOTE    Jeanette Bentley  FMW:[id number] DOB: 18-Nov-1955 DOA: 06/02/2023 PCP: [name], MD    Brief Narrative:  68 year old female admitted with recent URI, and subsequent symptoms and findings suggestive of acute pericarditis with large pericardial effusion.  Elevated ESR, CRP, echo that revealed pericardial effusion.  Of Significance patient passed out twice.   Assessment and Plan: Acute pericarditis: Symptoms, EKG, echocardiogram, elevated ESR/CRP all suggestive of acute pericarditis. Idiopathic viral etiology most likely. Small to moderate circumferential pericardial effusion-- repeat limited echo pending -cards consulted:  ibuprofen  600 mg 3 times daily with PPI, along with colchicine  0.6 mg twice daily.    syncope  - cards recommends outpatient 2-week Zio patch monitor to rule out any arrhythmias, given her syncope.   AKI, LFTs: -trend -unclear etiology-- ? From recent virus vs need for lupus work up outpatient  -no abdominal pain   Right shoulder pain: Chronic degenerative changes, no fracture or dislocation. -voltaren  gel added   DVT prophylaxis: SCDs Start: 06/03/23 0107    Code Status: Full Code   Disposition Plan:  Level of care: Telemetry Cardiac     Consultants:  cards   Subjective: No chest pain when lying flat  Objective: Vitals:   06/04/23 0800 06/04/23 0807 06/04/23 0808 06/04/23 1000  BP: 116/79   112/68  Pulse: 94   81  Resp: (!) 27   (!) 21  Temp:   97.9 F (36.6 C)   TempSrc:      SpO2: 96% 100%  98%  Weight:      Height:       No intake or output data in the 24 hours ending 06/04/23 1207 Filed Weights   06/03/23 0905  Weight: 81.6 kg    Examination:   General: Appearance:     Overweight female in no acute distress     Lungs:     respirations unlabored  Heart:    Normal heart rate. Normal rhythm. No murmurs, rubs, or gallops.    MS:   All extremities are intact.    Neurologic:   Awake, alert,  oriented x 3. No apparent focal neurological           defect.        Data Reviewed: I have personally reviewed following labs and imaging studies  CBC: Recent Labs  Lab 06/02/23 1635 06/03/23 0618 06/04/23 1131  WBC 12.2* 9.4 8.4  HGB 10.1* 8.7* 8.3*  HCT 28.1* 24.6* 23.3*  MCV 79.8* 81.2 80.9  PLT 470* 387 383   Basic Metabolic Panel: Recent Labs  Lab 06/02/23 1635 06/03/23 0618 06/04/23 0411  NA 136 140 141  K 3.8 3.8 4.1  CL 102 107 109  CO2 24 24 24   GLUCOSE 115* 105* 112*  BUN 15 13 20   CREATININE 1.06* 1.18* 1.34*  CALCIUM 9.2 8.6* 8.6*   GFR: Estimated Creatinine Clearance: 43.6 mL/min (A) (by C-G formula based on SCr of 1.34 mg/dL (H)). Liver Function Tests: Recent Labs  Lab 06/03/23 0618 06/04/23 0411  AST 91* 206*  ALT 100* 150*  ALKPHOS 86 127*  BILITOT 0.5 0.6  PROT 6.0* 6.1*  ALBUMIN 2.9* 2.8*   No results for input(s): LIPASE, AMYLASE in the last 168 hours. No results for input(s): AMMONIA in the last 168 hours. Coagulation Profile: No results for input(s): INR, PROTIME in the last 168 hours. Cardiac Enzymes: No results for input(s): CKTOTAL, CKMB, CKMBINDEX, TROPONINI in the last 168 hours. BNP (last 3 results) No results  for input(s): PROBNP in the last 8760 hours. HbA1C: No results for input(s): HGBA1C in the last 72 hours. CBG: No results for input(s): GLUCAP in the last 168 hours. Lipid Profile: No results for input(s): CHOL, HDL, LDLCALC, TRIG, CHOLHDL, LDLDIRECT in the last 72 hours. Thyroid Function Tests: Recent Labs    06/03/23 0618  TSH 3.643   Anemia Panel: Recent Labs    06/03/23 0618  VITAMINB12 244  FOLATE 7.5  FERRITIN 318*  TIBC 249*  IRON 16*  RETICCTPCT 1.1   Sepsis Labs: No results for input(s): PROCALCITON, LATICACIDVEN in the last 168 hours.  Recent Results (from the past 240 hours)  Resp panel by RT-PCR (RSV, Flu A&B, Covid) Anterior Nasal Swab     Status:  None   Collection Time: 06/02/23  5:20 PM   Specimen: Anterior Nasal Swab  Result Value Ref Range Status   SARS Coronavirus 2 by RT PCR NEGATIVE NEGATIVE Final    Comment: (NOTE) SARS-CoV-2 target nucleic acids are NOT DETECTED.  The SARS-CoV-2 RNA is generally detectable in upper respiratory specimens during the acute phase of infection. The lowest concentration of SARS-CoV-2 viral copies this assay can detect is 138 copies/mL. A negative result does not preclude SARS-Cov-2 infection and should not be used as the sole basis for treatment or other patient management decisions. A negative result may occur with  improper specimen collection/handling, submission of specimen other than nasopharyngeal swab, presence of viral mutation(s) within the areas targeted by this assay, and inadequate number of viral copies(<138 copies/mL). A negative result must be combined with clinical observations, patient history, and epidemiological information. The expected result is Negative.  Fact Sheet for Patients:  bloggercourse.com  Fact Sheet for Healthcare Providers:  seriousbroker.it  This test is no t yet approved or cleared by the [place]  FDA and  has been authorized for detection and/or diagnosis of SARS-CoV-2 by FDA under an Emergency Use Authorization (EUA). This EUA will remain  in effect (meaning this test can be used) for the duration of the COVID-19 declaration under Section 564(b)(1) of the Act, 21 U.S.C.section 360bbb-3(b)(1), unless the authorization is terminated  or revoked sooner.       Influenza A by PCR NEGATIVE NEGATIVE Final   Influenza B by PCR NEGATIVE NEGATIVE Final    Comment: (NOTE) The Xpert Xpress SARS-CoV-2/FLU/RSV plus assay is intended as an aid in the diagnosis of influenza from Nasopharyngeal swab specimens and should not be used as a sole basis for treatment. Nasal washings and aspirates are unacceptable  for Xpert Xpress SARS-CoV-2/FLU/RSV testing.  Fact Sheet for Patients: bloggercourse.com  Fact Sheet for Healthcare Providers: seriousbroker.it  This test is not yet approved or cleared by the [place]  FDA and has been authorized for detection and/or diagnosis of SARS-CoV-2 by FDA under an Emergency Use Authorization (EUA). This EUA will remain in effect (meaning this test can be used) for the duration of the COVID-19 declaration under Section 564(b)(1) of the Act, 21 U.S.C. section 360bbb-3(b)(1), unless the authorization is terminated or revoked.     Resp Syncytial Virus by PCR NEGATIVE NEGATIVE Final    Comment: (NOTE) Fact Sheet for Patients: bloggercourse.com  Fact Sheet for Healthcare Providers: seriousbroker.it  This test is not yet approved or cleared by the [place]  FDA and has been authorized for detection and/or diagnosis of SARS-CoV-2 by FDA under an Emergency Use Authorization (EUA). This EUA will remain in effect (meaning this test can be used) for the duration of the COVID-19  declaration under Section 564(b)(1) of the Act, 21 U.S.C. section 360bbb-3(b)(1), unless the authorization is terminated or revoked.  Performed at [facility], [address], [place], [place] [address]          Radiology Studies: ECHOCARDIOGRAM COMPLETE Result Date: 06/03/2023    ECHOCARDIOGRAM REPORT   Patient Name:   Jeanette Bentley Date of Exam: 06/03/2023 Medical Rec #:  [id number]             Height:       65.0 in Accession #:    [id number]            Weight:       180.0 lb Date of Birth:  [birth year]             BSA:          1.892 m Patient Age:    67 years              BP:           120/78 mmHg Patient Gender: F                     HR:           85 bpm. Exam Location:  Inpatient Procedure: 2D Echo, Cardiac Doppler and Color Doppler Indications:     Pericardial effusion  History:        Patient has no prior history of Echocardiogram examinations.                 Signs/Symptoms:Chest Pain.  Sonographer:    Juanita Shaw Referring Phys: VASUNDHRA RATHORE IMPRESSIONS  1. Left ventricular ejection fraction, by estimation, is 60 to 65%. The left ventricle has normal function. The left ventricle has no regional wall motion abnormalities. Left ventricular diastolic parameters are consistent with Grade I diastolic dysfunction (impaired relaxation).  2. Right ventricular systolic function is normal. The right ventricular size is normal.  3. Large pericardial effusion. The pericardial effusion is circumferential. There is no evidence of cardiac tamponade.  4. The mitral valve is normal in structure. Trivial mitral valve regurgitation. No evidence of mitral stenosis.  5. The aortic valve is tricuspid. Aortic valve regurgitation is trivial.  6. The inferior vena cava is dilated in size with <50% respiratory variability, suggesting right atrial pressure of 15 mmHg. Conclusion(s)/Recommendation(s): Although there is no echo current evidence of tamponade, right atrial pressure is high (dilated IVC). Recommend serial echo follow up. FINDINGS  Left Ventricle: Left ventricular ejection fraction, by estimation, is 60 to 65%. The left ventricle has normal function. The left ventricle has no regional wall motion abnormalities. The left ventricular internal cavity size was normal in size. There is  no left ventricular hypertrophy. Left ventricular diastolic parameters are consistent with Grade I diastolic dysfunction (impaired relaxation). Right Ventricle: The right ventricular size is normal. No increase in right ventricular wall thickness. Right ventricular systolic function is normal. Left Atrium: Left atrial size was normal in size. Right Atrium: Right atrial size was normal in size. Pericardium: A large pericardial effusion is present. The pericardial effusion is  circumferential. There is no evidence of cardiac tamponade. Mitral Valve: The mitral valve is normal in structure. Trivial mitral valve regurgitation. No evidence of mitral valve stenosis. MV peak gradient, 2.3 mmHg. The mean mitral valve gradient is 1.0 mmHg. Tricuspid Valve: The tricuspid valve is normal in structure. Tricuspid valve regurgitation is not demonstrated. Aortic Valve: The aortic valve is tricuspid. Aortic valve regurgitation  is trivial. Aortic valve mean gradient measures 2.0 mmHg. Aortic valve peak gradient measures 3.0 mmHg. Aortic valve area, by VTI measures 3.00 cm. Pulmonic Valve: The pulmonic valve was grossly normal. Pulmonic valve regurgitation is trivial. No evidence of pulmonic stenosis. Aorta: The aortic root is normal in size and structure. Venous: The inferior vena cava is dilated in size with less than 50% respiratory variability, suggesting right atrial pressure of 15 mmHg. IAS/Shunts: No atrial level shunt detected by color flow Doppler.  LEFT VENTRICLE PLAX 2D LVIDd:         3.70 cm      Diastology LVIDs:         2.40 cm      LV e' medial:    6.85 cm/s LV PW:         1.20 cm      LV E/e' medial:  7.3 LV IVS:        0.90 cm      LV e' lateral:   7.29 cm/s LVOT diam:     1.80 cm      LV E/e' lateral: 6.8 LV SV:         37 LV SV Index:   20 LVOT Area:     2.54 cm  LV Volumes (MOD) LV vol d, MOD A2C: 107.0 ml LV vol d, MOD A4C: 122.0 ml LV vol s, MOD A2C: 43.8 ml LV vol s, MOD A4C: 35.5 ml LV SV MOD A2C:     63.2 ml LV SV MOD A4C:     122.0 ml LV SV MOD BP:      75.0 ml RIGHT VENTRICLE             IVC RV Basal diam:  3.10 cm     IVC diam: 2.60 cm RV Mid diam:    2.80 cm RV S prime:     11.60 cm/s TAPSE (M-mode): 3.0 cm LEFT ATRIUM           Index        RIGHT ATRIUM           Index LA diam:      2.00 cm 1.06 cm/m   RA Area:     10.40 cm LA Vol (A2C): 42.9 ml 22.68 ml/m  RA Volume:   22.40 ml  11.84 ml/m LA Vol (A4C): 23.4 ml 12.37 ml/m  AORTIC VALVE                    PULMONIC  VALVE AV Area (Vmax):    2.61 cm     PV Vmax:          0.75 m/s AV Area (Vmean):   2.38 cm     PV Peak grad:     2.3 mmHg AV Area (VTI):     3.00 cm     PR End Diast Vel: 3.26 msec AV Vmax:           87.30 cm/s AV Vmean:          58.800 cm/s AV VTI:            0.123 m AV Peak Grad:      3.0 mmHg AV Mean Grad:      2.0 mmHg LVOT Vmax:         89.40 cm/s LVOT Vmean:        54.900 cm/s LVOT VTI:          0.145 m LVOT/AV VTI ratio: 1.18  AORTA Ao Root diam: 3.40 cm Ao Asc diam:  3.20 cm MITRAL VALVE MV Area (PHT): 4.29 cm    SHUNTS MV Area VTI:   2.22 cm    Systemic VTI:  0.14 m MV Peak grad:  2.3 mmHg    Systemic Diam: 1.80 cm MV Mean grad:  1.0 mmHg MV Vmax:       0.76 m/s MV Vmean:      53.5 cm/s MV Decel Time: 177 msec MV E velocity: 49.80 cm/s MV A velocity: 65.50 cm/s MV E/A ratio:  0.76 Mihai Croitoru MD Electronically signed by Jerel Balding MD Signature Date/Time: 06/03/2023/2:52:16 PM    Final    DG Hip Unilat W or Wo Pelvis 2-3 Views Left Result Date: 06/02/2023 CLINICAL DATA:  Left hip pain after a fall EXAM: DG HIP (WITH OR WITHOUT PELVIS) 2-3V LEFT COMPARISON:  None Available. FINDINGS: Mild bilateral osteoarthritis of the hips. No evidence of acute fracture or dislocation. IMPRESSION: No acute or traumatic finding. Mild bilateral osteoarthritis of the hips. Electronically Signed   By: Oneil Officer M.D.   On: 06/02/2023 21:52   DG Shoulder Right Result Date: 06/02/2023 CLINICAL DATA:  Right shoulder pain. Generalized weakness with 2 falls today. EXAM: RIGHT SHOULDER - 2+ VIEW COMPARISON:  None Available. FINDINGS: Degenerative changes in the acromioclavicular and glenohumeral joints with prominent subacromial and humeral osteophyte formation. Bone spurs demonstrate near bone on bone appearance at the subacromial space. No evidence of acute fracture or dislocation. No focal bone lesion or bone destruction. Soft tissues are unremarkable. IMPRESSION: Prominent degenerative changes in the right  shoulder. No acute displaced fractures identified. Electronically Signed   By: Elsie Gravely M.D.   On: 06/02/2023 21:52   CT Angio Chest PE W and/or Wo Contrast Result Date: 06/02/2023 CLINICAL DATA:  Pulmonary embolus suspected with high probability. Generalized weakness and falls today. Shortness of breath and chest pressure. Recent diagnosis of upper respiratory infection on amoxicillin . EXAM: CT ANGIOGRAPHY CHEST WITH CONTRAST TECHNIQUE: Multidetector CT imaging of the chest was performed using the standard protocol during bolus administration of intravenous contrast. Multiplanar CT image reconstructions and MIPs were obtained to evaluate the vascular anatomy. RADIATION DOSE REDUCTION: This exam was performed according to the departmental dose-optimization program which includes automated exposure control, adjustment of the mA and/or kV according to patient size and/or use of iterative reconstruction technique. CONTRAST:  75mL OMNIPAQUE  IOHEXOL  350 MG/ML SOLN COMPARISON:  Chest radiograph 06/02/2023 FINDINGS: Cardiovascular: Technically adequate study with good opacification of the central and segmental pulmonary arteries. Mild motion and streak artifact. No focal filling defects. No evidence of significant pulmonary embolus. Normal heart size. Moderate pericardial effusion. Normal caliber thoracic aorta. No aortic dissection. Great vessel origins are patent. Mediastinum/Nodes: No enlarged mediastinal, hilar, or axillary lymph nodes. Thyroid gland, trachea, and esophagus demonstrate no significant findings. Lungs/Pleura: Small bilateral pleural effusions. Bandlike opacities in the lung bases likely represent scarring or atelectasis. No airspace disease or consolidation. Upper Abdomen: No acute abnormalities. Musculoskeletal: Degenerative changes in the spine. No acute bony abnormalities. Review of the MIP images confirms the above findings. IMPRESSION: 1. No evidence of significant pulmonary embolus. 2.  Moderate pericardial effusion. 3. Small bilateral pleural effusions. Linear atelectasis or scarring in the lung bases. Electronically Signed   By: Elsie Gravely M.D.   On: 06/02/2023 21:36   CT Cervical Spine Wo Contrast Result Date: 06/02/2023 CLINICAL DATA:  Neck trauma. Generalized weakness with 2 falls today. EXAM: CT CERVICAL SPINE WITHOUT CONTRAST TECHNIQUE:  Multidetector CT imaging of the cervical spine was performed without intravenous contrast. Multiplanar CT image reconstructions were also generated. RADIATION DOSE REDUCTION: This exam was performed according to the departmental dose-optimization program which includes automated exposure control, adjustment of the mA and/or kV according to patient size and/or use of iterative reconstruction technique. COMPARISON:  Cervical spine radiographs 09/15/2012 FINDINGS: Alignment: Normal alignment. Skull base and vertebrae: No vertebral compression deformities. No focal bone lesion or bone destruction. Bone cortex appears intact. Skull base appears intact. Soft tissues and spinal canal: No prevertebral soft tissue swelling. No abnormal paraspinal soft tissue mass or infiltration. Disc levels: Degenerative changes with disc space narrowing and endplate osteophyte formation throughout the cervical spine. Degenerative changes in the facet joints. Upper chest: Lung apices are clear. Other: None. IMPRESSION: Normal alignment of the cervical spine. Mild degenerative changes. No acute displaced fractures identified. Electronically Signed   By: Elsie Gravely M.D.   On: 06/02/2023 21:32   CT Head Wo Contrast Result Date: 06/02/2023 CLINICAL DATA:  Minor head trauma. Generalized weakness and falls x2 today. EXAM: CT HEAD WITHOUT CONTRAST TECHNIQUE: Contiguous axial images were obtained from the base of the skull through the vertex without intravenous contrast. RADIATION DOSE REDUCTION: This exam was performed according to the departmental dose-optimization program  which includes automated exposure control, adjustment of the mA and/or kV according to patient size and/or use of iterative reconstruction technique. COMPARISON:  None Available. FINDINGS: Brain: No evidence of acute infarction, hemorrhage, hydrocephalus, extra-axial collection or mass lesion/mass effect. Vascular: No hyperdense vessel or unexpected calcification. Skull: Normal. Negative for fracture or focal lesion. Sinuses/Orbits: Paranasal sinuses and mastoid air cells are clear. Other: None. IMPRESSION: No acute intracranial abnormalities. Electronically Signed   By: Elsie Gravely M.D.   On: 06/02/2023 21:29   DG Chest 2 View Result Date: 06/02/2023 CLINICAL DATA:  Upper respiratory infection. EXAM: CHEST - 2 VIEW COMPARISON:  None Available. FINDINGS: There are bibasilar streaky atelectasis or scarring. Pneumonia is not excluded. Blunting of the costophrenic angles may be scarring or represent trace pleural effusion. No pneumothorax. The cardiac silhouette is within normal limits. Atherosclerotic calcification of the aorta. No acute osseous pathology. IMPRESSION: Bibasilar atelectasis or scarring. Pneumonia is not excluded. Electronically Signed   By: Vanetta Chou M.D.   On: 06/02/2023 18:24        Scheduled Meds:  colchicine   0.6 mg Oral BID   ibuprofen   600 mg Oral TID   pantoprazole   40 mg Oral Daily   Continuous Infusions:  sodium chloride  50 mL/hr at 06/04/23 0401     LOS: 0 days    Time spent: 45 minutes spent on chart review, discussion with nursing staff, consultants, updating family and interview/physical exam; more than 50% of that time was spent in counseling and/or coordination of care.    Harlene RAYMOND Bowl, DO [facility] Available via Epic secure chat 7am-7pm After these hours, please refer to coverage provider listed on [email or web address] 06/04/2023, 12:07 PM

## 2023-06-05 ENCOUNTER — Inpatient Hospital Stay (HOSPITAL_COMMUNITY): Payer: Medicare Other

## 2023-06-05 DIAGNOSIS — R079 Chest pain, unspecified: Secondary | ICD-10-CM

## 2023-06-05 DIAGNOSIS — I3139 Other pericardial effusion (noninflammatory): Secondary | ICD-10-CM

## 2023-06-05 DIAGNOSIS — I300 Acute nonspecific idiopathic pericarditis: Secondary | ICD-10-CM | POA: Diagnosis not present

## 2023-06-05 LAB — CBC
HCT: 23.5 % — ABNORMAL LOW (ref 36.0–46.0)
Hemoglobin: 8.6 g/dL — ABNORMAL LOW (ref 12.0–15.0)
MCH: 29 pg (ref 26.0–34.0)
MCHC: 36.6 g/dL — ABNORMAL HIGH (ref 30.0–36.0)
MCV: 79.1 fL — ABNORMAL LOW (ref 80.0–100.0)
Platelets: 423 10*3/uL — ABNORMAL HIGH (ref 150–400)
RBC: 2.97 MIL/uL — ABNORMAL LOW (ref 3.87–5.11)
RDW: 12.6 % (ref 11.5–15.5)
WBC: 9.1 10*3/uL (ref 4.0–10.5)
nRBC: 0 % (ref 0.0–0.2)

## 2023-06-05 LAB — C-REACTIVE PROTEIN: CRP: 19.1 mg/dL — ABNORMAL HIGH (ref <1.0)

## 2023-06-05 LAB — COMPREHENSIVE METABOLIC PANEL
ALT: 107 U/L — ABNORMAL HIGH (ref 0–44)
AST: 64 U/L — ABNORMAL HIGH (ref 15–41)
Albumin: 2.6 g/dL — ABNORMAL LOW (ref 3.5–5.0)
Alkaline Phosphatase: 114 U/L (ref 38–126)
Anion gap: 10 (ref 5–15)
BUN: 12 mg/dL (ref 8–23)
CO2: 23 mmol/L (ref 22–32)
Calcium: 8.5 mg/dL — ABNORMAL LOW (ref 8.9–10.3)
Chloride: 108 mmol/L (ref 98–111)
Creatinine, Ser: 0.92 mg/dL (ref 0.44–1.00)
GFR, Estimated: >60 mL/min (ref >60)
Glucose, Bld: 93 mg/dL (ref 70–99)
Potassium: 3.7 mmol/L (ref 3.5–5.1)
Sodium: 141 mmol/L (ref 135–145)
Total Bilirubin: 0.4 mg/dL (ref 0.0–1.2)
Total Protein: 6 g/dL — ABNORMAL LOW (ref 6.5–8.1)

## 2023-06-05 LAB — ECHOCARDIOGRAM LIMITED
Height: 66 in
Weight: 2958.4 [oz_av]

## 2023-06-05 LAB — SEDIMENTATION RATE: Sed Rate: 90 mm/h — ABNORMAL HIGH (ref 0–22)

## 2023-06-05 MED ORDER — IBUPROFEN 200 MG PO TABS
200.0000 mg | ORAL_TABLET | Freq: Once | ORAL | Status: AC
  Administered 2023-06-05: 200 mg via ORAL
  Filled 2023-06-05: qty 1

## 2023-06-05 MED ORDER — BISMUTH SUBSALICYLATE 262 MG/15ML PO SUSP
30.0000 mL | ORAL | Status: DC | PRN
  Administered 2023-06-05: 30 mL via ORAL
  Filled 2023-06-05: qty 236

## 2023-06-05 MED ORDER — IBUPROFEN 800 MG PO TABS
800.0000 mg | ORAL_TABLET | Freq: Three times a day (TID) | ORAL | Status: DC
  Administered 2023-06-05 – 2023-06-06 (×3): 800 mg via ORAL
  Filled 2023-06-05 (×3): qty 1

## 2023-06-05 NOTE — Progress Notes (Addendum)
   Patient Name: Jeanette Bentley Date of Encounter: 06/05/2023 [facility] Cardiologist: None   Interval Summary  .    No chest pain No lightheadedness  Echo 2/5: Large effusion, IVC dilated, but no tamponade  Vital Signs .    Vitals:   06/04/23 1955 06/04/23 2342 06/05/23 0337 06/05/23 0804  BP: 132/77 128/74 116/64 126/71  Pulse:  87 82 89  Resp: 18 16 20 20   Temp: 98.4 F (36.9 C) 98.2 F (36.8 C) 98.5 F (36.9 C) 98.4 F (36.9 C)  TempSrc: Oral Oral Oral Oral  SpO2: 97% 95% 95% 95%  Weight:      Height:        Intake/Output Summary (Last 24 hours) at 06/05/2023 0905 Last data filed at 06/04/2023 2342 Gross per 24 hour  Intake 1243.5 ml  Output --  Net 1243.5 ml      06/04/2023    4:53 PM 06/03/2023    9:05 AM 10/13/2013    2:33 PM  Last 3 Weights  Weight (lbs) 184 lb 14.4 oz 180 lb 191 lb 6.4 oz  Weight (kg) 83.87 kg 81.647 kg 86.818 kg      Telemetry/ECG    No arrhythmia Sinus tachycardia with minimal activity  Physical Exam .   Physical Exam Vitals and nursing note reviewed.  Constitutional:      General: She is not in acute distress. Neck:     Vascular: JVD present.  Cardiovascular:     Rate and Rhythm: Normal rate and regular rhythm.     Heart sounds: Normal heart sounds. No murmur heard. Pulmonary:     Effort: Pulmonary effort is normal.     Breath sounds: Normal breath sounds. No wheezing or rales.  Musculoskeletal:     Right lower leg: No edema.     Left lower leg: No edema.      Assessment & Plan .     68 year old female with hyperlipidemia, anemia, recent URI, acute pericarditis with pericardial effusion   Acute pericarditis: Symptoms, EKG, echocardiogram, elevated ESR/CRP all suggestive of acute pericarditis. Idiopathic viral etiology most likely. Moderate to large circumferential pericardial effusion. Dilated IVC, but no tamponade. Although hemodynamically stable at this time, I remain concerned about her effusion,  especially in light of still rising CRP. Increase ibuprofen  to 800 mg tid with food for up to 1 week. Continue colchicine  0.6 mg bid. Continue PPI. Will repeat limited echocardiogram later today.   In the setting of chronic anemia, elevated LFTs, Cr, pericardial and pleural effusions, she may warrant with outpatient evaluation for lupus.   AKI, LFTs: Improving.  For questions or updates, please contact [facility] Please consult [email or web address] for contact info under        Signed, Newman JINNY Lawrence, MD

## 2023-06-05 NOTE — Progress Notes (Signed)
 PROGRESS NOTE    Jeanette Bentley  FMW:[id number] DOB: 10-25-1955 DOA: 06/02/2023 PCP: [name], MD    Brief Narrative:  68 year old female admitted with recent URI, and subsequent symptoms and findings suggestive of acute pericarditis with large pericardial effusion.  Elevated ESR, CRP, echo that revealed pericardial effusion.  Of Significance patient passed out twice.   Assessment and Plan: Acute pericarditis: Symptoms, EKG, echocardiogram, elevated ESR/CRP all suggestive of acute pericarditis. Idiopathic viral etiology most likely in setting of recent URI/viral illness -cards consulted:  ibuprofen  800 mg 3 times daily with PPI, along with colchicine  0.6 mg twice daily. -repeat limited echo again  2/6 -may need lupus work up outpatient   syncope  - cards recommends outpatient 2-week Zio patch monitor to rule out any arrhythmias, given her syncope.   AKI, LFTs: -trending down   Right shoulder pain: Chronic degenerative changes, no fracture or dislocation. -voltaren  gel added   DVT prophylaxis: SCDs Start: 06/03/23 0107    Code Status: Full Code   Disposition Plan:  Level of care: Telemetry Cardiac     Consultants:  cards   Subjective: No more chest pain  Objective: Vitals:   06/04/23 2342 06/05/23 0337 06/05/23 0804 06/05/23 1054  BP: 128/74 116/64 126/71 (!) 149/85  Pulse: 87 82 89 92  Resp: 16 20 20 18   Temp: 98.2 F (36.8 C) 98.5 F (36.9 C) 98.4 F (36.9 C) 98.2 F (36.8 C)  TempSrc: Oral Oral Oral Oral  SpO2: 95% 95% 95% 97%  Weight:      Height:        Intake/Output Summary (Last 24 hours) at 06/05/2023 1232 Last data filed at 06/04/2023 2342 Gross per 24 hour  Intake 1243.5 ml  Output --  Net 1243.5 ml   Filed Weights   06/03/23 0905 06/04/23 1653  Weight: 81.6 kg 83.9 kg    Examination:  In bed, NAD rrr    Data Reviewed: I have personally reviewed following labs and imaging studies  CBC: Recent Labs  Lab  06/02/23 1635 06/03/23 0618 06/04/23 1131 06/05/23 0207  WBC 12.2* 9.4 8.4 9.1  HGB 10.1* 8.7* 8.3* 8.6*  HCT 28.1* 24.6* 23.3* 23.5*  MCV 79.8* 81.2 80.9 79.1*  PLT 470* 387 383 423*   Basic Metabolic Panel: Recent Labs  Lab 06/02/23 1635 06/03/23 0618 06/04/23 0411 06/05/23 0207  NA 136 140 141 141  K 3.8 3.8 4.1 3.7  CL 102 107 109 108  CO2 24 24 24 23   GLUCOSE 115* 105* 112* 93  BUN 15 13 20 12   CREATININE 1.06* 1.18* 1.34* 0.92  CALCIUM 9.2 8.6* 8.6* 8.5*   GFR: Estimated Creatinine Clearance: 65.6 mL/min (by C-G formula based on SCr of 0.92 mg/dL). Liver Function Tests: Recent Labs  Lab 06/03/23 0618 06/04/23 0411 06/05/23 0207  AST 91* 206* 64*  ALT 100* 150* 107*  ALKPHOS 86 127* 114  BILITOT 0.5 0.6 0.4  PROT 6.0* 6.1* 6.0*  ALBUMIN 2.9* 2.8* 2.6*   No results for input(s): LIPASE, AMYLASE in the last 168 hours. No results for input(s): AMMONIA in the last 168 hours. Coagulation Profile: No results for input(s): INR, PROTIME in the last 168 hours. Cardiac Enzymes: No results for input(s): CKTOTAL, CKMB, CKMBINDEX, TROPONINI in the last 168 hours. BNP (last 3 results) No results for input(s): PROBNP in the last 8760 hours. HbA1C: No results for input(s): HGBA1C in the last 72 hours. CBG: No results for input(s): GLUCAP in the last 168 hours. Lipid  Profile: No results for input(s): CHOL, HDL, LDLCALC, TRIG, CHOLHDL, LDLDIRECT in the last 72 hours. Thyroid Function Tests: Recent Labs    06/03/23 0618  TSH 3.643   Anemia Panel: Recent Labs    06/03/23 0618  VITAMINB12 244  FOLATE 7.5  FERRITIN 318*  TIBC 249*  IRON 16*  RETICCTPCT 1.1   Sepsis Labs: No results for input(s): PROCALCITON, LATICACIDVEN in the last 168 hours.  Recent Results (from the past 240 hours)  Resp panel by RT-PCR (RSV, Flu A&B, Covid) Anterior Nasal Swab     Status: None   Collection Time: 06/02/23  5:20 PM   Specimen:  Anterior Nasal Swab  Result Value Ref Range Status   SARS Coronavirus 2 by RT PCR NEGATIVE NEGATIVE Final    Comment: (NOTE) SARS-CoV-2 target nucleic acids are NOT DETECTED.  The SARS-CoV-2 RNA is generally detectable in upper respiratory specimens during the acute phase of infection. The lowest concentration of SARS-CoV-2 viral copies this assay can detect is 138 copies/mL. A negative result does not preclude SARS-Cov-2 infection and should not be used as the sole basis for treatment or other patient management decisions. A negative result may occur with  improper specimen collection/handling, submission of specimen other than nasopharyngeal swab, presence of viral mutation(s) within the areas targeted by this assay, and inadequate number of viral copies(<138 copies/mL). A negative result must be combined with clinical observations, patient history, and epidemiological information. The expected result is Negative.  Fact Sheet for Patients:  bloggercourse.com  Fact Sheet for Healthcare Providers:  seriousbroker.it  This test is no t yet approved or cleared by the [place]  FDA and  has been authorized for detection and/or diagnosis of SARS-CoV-2 by FDA under an Emergency Use Authorization (EUA). This EUA will remain  in effect (meaning this test can be used) for the duration of the COVID-19 declaration under Section 564(b)(1) of the Act, 21 U.S.C.section 360bbb-3(b)(1), unless the authorization is terminated  or revoked sooner.       Influenza A by PCR NEGATIVE NEGATIVE Final   Influenza B by PCR NEGATIVE NEGATIVE Final    Comment: (NOTE) The Xpert Xpress SARS-CoV-2/FLU/RSV plus assay is intended as an aid in the diagnosis of influenza from Nasopharyngeal swab specimens and should not be used as a sole basis for treatment. Nasal washings and aspirates are unacceptable for Xpert Xpress SARS-CoV-2/FLU/RSV testing.  Fact  Sheet for Patients: bloggercourse.com  Fact Sheet for Healthcare Providers: seriousbroker.it  This test is not yet approved or cleared by the [place]  FDA and has been authorized for detection and/or diagnosis of SARS-CoV-2 by FDA under an Emergency Use Authorization (EUA). This EUA will remain in effect (meaning this test can be used) for the duration of the COVID-19 declaration under Section 564(b)(1) of the Act, 21 U.S.C. section 360bbb-3(b)(1), unless the authorization is terminated or revoked.     Resp Syncytial Virus by PCR NEGATIVE NEGATIVE Final    Comment: (NOTE) Fact Sheet for Patients: bloggercourse.com  Fact Sheet for Healthcare Providers: seriousbroker.it  This test is not yet approved or cleared by the [place]  FDA and has been authorized for detection and/or diagnosis of SARS-CoV-2 by FDA under an Emergency Use Authorization (EUA). This EUA will remain in effect (meaning this test can be used) for the duration of the COVID-19 declaration under Section 564(b)(1) of the Act, 21 U.S.C. section 360bbb-3(b)(1), unless the authorization is terminated or revoked.  Performed at [facility], [address] [place], [place] [address]  Radiology Studies: ECHOCARDIOGRAM LIMITED Result Date: 06/04/2023    ECHOCARDIOGRAM LIMITED REPORT   Patient Name:   Jeanette Bentley Date of Exam: 06/04/2023 Medical Rec #:  [id number]             Height:       65.0 in Accession #:    [id number]            Weight:       180.0 lb Date of Birth:  1956-02-02             BSA:          1.892 m Patient Age:    67 years              BP:           116/79 mmHg Patient Gender: F                     HR:           88 bpm. Exam Location:  Inpatient Procedure: Limited Echo and Cardiac Doppler Indications:    Pericardial Effusion  History:        Patient has prior  history of Echocardiogram examinations, most                 recent 06/03/2023.  Sonographer:    Jayson Gaskins Referring Phys: [id number] ZANE ADAMS IMPRESSIONS  1. Large pericardial effusion surrounds heart Measures 16 to 19 mm in maximal dimension. There is no RA or RV collapse There is no significant variation in mitral inflow by doppler IVC is dilated without respiratory variation. OVerall does not meet full  echo criteria for tamponade CLinical correlation indicated.  2. The inferior vena cava is dilated in size with <50% respiratory variability, suggesting right atrial pressure of 15 mmHg. FINDINGS  Left Ventricle: The left ventricular internal cavity size was normal in size. Pericardium: Large pericardial effusion surrounds heart Measures 16 to 19 mm in maximal dimension. There is no RA or RV collapse There is no significant variation in mitral inflow by doppler IVC is dilated without respiratory variation. OVerall does not meet full echo criteria for tamponade CLinical correlation indicated. Venous: The inferior vena cava is dilated in size with less than 50% respiratory variability, suggesting right atrial pressure of 15 mmHg. LEFT VENTRICLE PLAX 2D LVIDd:         3.70 cm LVIDs:         2.50 cm LV PW:         0.90 cm LV IVS:        0.90 cm LVOT diam:     1.90 cm LVOT Area:     2.84 cm   AORTA Ao Root diam: 3.10 cm  SHUNTS Systemic Diam: 1.90 cm Vina Gull MD Electronically signed by Vina Gull MD Signature Date/Time: 06/04/2023/2:19:56 PM    Final    ECHOCARDIOGRAM COMPLETE Result Date: 06/03/2023    ECHOCARDIOGRAM REPORT   Patient Name:   Jeanette Bentley Date of Exam: 06/03/2023 Medical Rec #:  [id number]             Height:       65.0 in Accession #:    [id number]            Weight:       180.0 lb Date of Birth:  [birth year]             BSA:          1.892  m Patient Age:    67 years              BP:           120/78 mmHg Patient Gender: F                     HR:           85 bpm. Exam Location:  Inpatient  Procedure: 2D Echo, Cardiac Doppler and Color Doppler Indications:    Pericardial effusion  History:        Patient has no prior history of Echocardiogram examinations.                 Signs/Symptoms:Chest Pain.  Sonographer:    Juanita Shaw Referring Phys: VASUNDHRA RATHORE IMPRESSIONS  1. Left ventricular ejection fraction, by estimation, is 60 to 65%. The left ventricle has normal function. The left ventricle has no regional wall motion abnormalities. Left ventricular diastolic parameters are consistent with Grade I diastolic dysfunction (impaired relaxation).  2. Right ventricular systolic function is normal. The right ventricular size is normal.  3. Large pericardial effusion. The pericardial effusion is circumferential. There is no evidence of cardiac tamponade.  4. The mitral valve is normal in structure. Trivial mitral valve regurgitation. No evidence of mitral stenosis.  5. The aortic valve is tricuspid. Aortic valve regurgitation is trivial.  6. The inferior vena cava is dilated in size with <50% respiratory variability, suggesting right atrial pressure of 15 mmHg. Conclusion(s)/Recommendation(s): Although there is no echo current evidence of tamponade, right atrial pressure is high (dilated IVC). Recommend serial echo follow up. FINDINGS  Left Ventricle: Left ventricular ejection fraction, by estimation, is 60 to 65%. The left ventricle has normal function. The left ventricle has no regional wall motion abnormalities. The left ventricular internal cavity size was normal in size. There is  no left ventricular hypertrophy. Left ventricular diastolic parameters are consistent with Grade I diastolic dysfunction (impaired relaxation). Right Ventricle: The right ventricular size is normal. No increase in right ventricular wall thickness. Right ventricular systolic function is normal. Left Atrium: Left atrial size was normal in size. Right Atrium: Right atrial size was normal in size. Pericardium: A large  pericardial effusion is present. The pericardial effusion is circumferential. There is no evidence of cardiac tamponade. Mitral Valve: The mitral valve is normal in structure. Trivial mitral valve regurgitation. No evidence of mitral valve stenosis. MV peak gradient, 2.3 mmHg. The mean mitral valve gradient is 1.0 mmHg. Tricuspid Valve: The tricuspid valve is normal in structure. Tricuspid valve regurgitation is not demonstrated. Aortic Valve: The aortic valve is tricuspid. Aortic valve regurgitation is trivial. Aortic valve mean gradient measures 2.0 mmHg. Aortic valve peak gradient measures 3.0 mmHg. Aortic valve area, by VTI measures 3.00 cm. Pulmonic Valve: The pulmonic valve was grossly normal. Pulmonic valve regurgitation is trivial. No evidence of pulmonic stenosis. Aorta: The aortic root is normal in size and structure. Venous: The inferior vena cava is dilated in size with less than 50% respiratory variability, suggesting right atrial pressure of 15 mmHg. IAS/Shunts: No atrial level shunt detected by color flow Doppler.  LEFT VENTRICLE PLAX 2D LVIDd:         3.70 cm      Diastology LVIDs:         2.40 cm      LV e' medial:    6.85 cm/s LV PW:         1.20 cm  LV E/e' medial:  7.3 LV IVS:        0.90 cm      LV e' lateral:   7.29 cm/s LVOT diam:     1.80 cm      LV E/e' lateral: 6.8 LV SV:         37 LV SV Index:   20 LVOT Area:     2.54 cm  LV Volumes (MOD) LV vol d, MOD A2C: 107.0 ml LV vol d, MOD A4C: 122.0 ml LV vol s, MOD A2C: 43.8 ml LV vol s, MOD A4C: 35.5 ml LV SV MOD A2C:     63.2 ml LV SV MOD A4C:     122.0 ml LV SV MOD BP:      75.0 ml RIGHT VENTRICLE             IVC RV Basal diam:  3.10 cm     IVC diam: 2.60 cm RV Mid diam:    2.80 cm RV S prime:     11.60 cm/s TAPSE (M-mode): 3.0 cm LEFT ATRIUM           Index        RIGHT ATRIUM           Index LA diam:      2.00 cm 1.06 cm/m   RA Area:     10.40 cm LA Vol (A2C): 42.9 ml 22.68 ml/m  RA Volume:   22.40 ml  11.84 ml/m LA Vol (A4C): 23.4  ml 12.37 ml/m  AORTIC VALVE                    PULMONIC VALVE AV Area (Vmax):    2.61 cm     PV Vmax:          0.75 m/s AV Area (Vmean):   2.38 cm     PV Peak grad:     2.3 mmHg AV Area (VTI):     3.00 cm     PR End Diast Vel: 3.26 msec AV Vmax:           87.30 cm/s AV Vmean:          58.800 cm/s AV VTI:            0.123 m AV Peak Grad:      3.0 mmHg AV Mean Grad:      2.0 mmHg LVOT Vmax:         89.40 cm/s LVOT Vmean:        54.900 cm/s LVOT VTI:          0.145 m LVOT/AV VTI ratio: 1.18  AORTA Ao Root diam: 3.40 cm Ao Asc diam:  3.20 cm MITRAL VALVE MV Area (PHT): 4.29 cm    SHUNTS MV Area VTI:   2.22 cm    Systemic VTI:  0.14 m MV Peak grad:  2.3 mmHg    Systemic Diam: 1.80 cm MV Mean grad:  1.0 mmHg MV Vmax:       0.76 m/s MV Vmean:      53.5 cm/s MV Decel Time: 177 msec MV E velocity: 49.80 cm/s MV A velocity: 65.50 cm/s MV E/A ratio:  0.76 Mihai Croitoru MD Electronically signed by Jerel Balding MD Signature Date/Time: 06/03/2023/2:52:16 PM    Final         Scheduled Meds:  colchicine   0.6 mg Oral BID   vitamin B-12  1,000 mcg Oral Daily   diclofenac  Sodium  2 g Topical  QID   ibuprofen   800 mg Oral TID   pantoprazole   40 mg Oral Daily   Continuous Infusions:     LOS: 1 day    Time spent: 45 minutes spent on chart review, discussion with nursing staff, consultants, updating family and interview/physical exam; more than 50% of that time was spent in counseling and/or coordination of care.    Harlene RAYMOND Bowl, DO [facility] Available via Epic secure chat 7am-7pm After these hours, please refer to coverage provider listed on [email or web address] 06/05/2023, 12:32 PM

## 2023-06-06 ENCOUNTER — Other Ambulatory Visit: Payer: Self-pay | Admitting: Home Health

## 2023-06-06 ENCOUNTER — Other Ambulatory Visit (HOSPITAL_COMMUNITY): Payer: Self-pay

## 2023-06-06 DIAGNOSIS — I319 Disease of pericardium, unspecified: Secondary | ICD-10-CM

## 2023-06-06 DIAGNOSIS — I3131 Malignant pericardial effusion in diseases classified elsewhere: Secondary | ICD-10-CM

## 2023-06-06 DIAGNOSIS — I3139 Other pericardial effusion (noninflammatory): Secondary | ICD-10-CM | POA: Diagnosis not present

## 2023-06-06 LAB — COMPREHENSIVE METABOLIC PANEL
ALT: 76 U/L — ABNORMAL HIGH (ref 0–44)
AST: 28 U/L (ref 15–41)
Albumin: 2.6 g/dL — ABNORMAL LOW (ref 3.5–5.0)
Alkaline Phosphatase: 103 U/L (ref 38–126)
Anion gap: 12 (ref 5–15)
BUN: 11 mg/dL (ref 8–23)
CO2: 24 mmol/L (ref 22–32)
Calcium: 8.5 mg/dL — ABNORMAL LOW (ref 8.9–10.3)
Chloride: 104 mmol/L (ref 98–111)
Creatinine, Ser: 1.03 mg/dL — ABNORMAL HIGH (ref 0.44–1.00)
GFR, Estimated: 60 mL/min — ABNORMAL LOW (ref >60)
Glucose, Bld: 91 mg/dL (ref 70–99)
Potassium: 3.4 mmol/L — ABNORMAL LOW (ref 3.5–5.1)
Sodium: 140 mmol/L (ref 135–145)
Total Bilirubin: 0.3 mg/dL (ref 0.0–1.2)
Total Protein: 6.1 g/dL — ABNORMAL LOW (ref 6.5–8.1)

## 2023-06-06 LAB — CBC
HCT: 23.3 % — ABNORMAL LOW (ref 36.0–46.0)
Hemoglobin: 8.4 g/dL — ABNORMAL LOW (ref 12.0–15.0)
MCH: 28.6 pg (ref 26.0–34.0)
MCHC: 36.1 g/dL — ABNORMAL HIGH (ref 30.0–36.0)
MCV: 79.3 fL — ABNORMAL LOW (ref 80.0–100.0)
Platelets: 444 10*3/uL — ABNORMAL HIGH (ref 150–400)
RBC: 2.94 MIL/uL — ABNORMAL LOW (ref 3.87–5.11)
RDW: 12.4 % (ref 11.5–15.5)
WBC: 8.1 10*3/uL (ref 4.0–10.5)
nRBC: 0 % (ref 0.0–0.2)

## 2023-06-06 LAB — C-REACTIVE PROTEIN: CRP: 16.1 mg/dL — ABNORMAL HIGH (ref <1.0)

## 2023-06-06 MED ORDER — CYANOCOBALAMIN 1000 MCG PO TABS: 1000.0000 ug | ORAL_TABLET | Freq: Every day | ORAL | Status: AC

## 2023-06-06 MED ORDER — COLCHICINE 0.6 MG PO TABS
0.6000 mg | ORAL_TABLET | Freq: Two times a day (BID) | ORAL | 0 refills | Status: DC
  Filled 2023-06-06 (×2): qty 60, 30d supply, fill #0

## 2023-06-06 MED ORDER — PANTOPRAZOLE SODIUM 40 MG PO TBEC
40.0000 mg | DELAYED_RELEASE_TABLET | Freq: Every day | ORAL | 0 refills | Status: DC
  Filled 2023-06-06: qty 30, 30d supply, fill #0

## 2023-06-06 MED ORDER — IBUPROFEN 800 MG PO TABS
800.0000 mg | ORAL_TABLET | Freq: Three times a day (TID) | ORAL | 0 refills | Status: DC
  Filled 2023-06-06: qty 90, 30d supply, fill #0

## 2023-06-06 NOTE — Discharge Summary (Signed)
 Physician Discharge Summary  Jeanette Bentley FMW:[id number] DOB: [birth year] DOA: 06/02/2023  PCP: Jeanette Pellet, MD  Admit date: 06/02/2023 Discharge date: 06/06/2023  Admitted From: home Discharge disposition: home   Recommendations for Outpatient Follow-Up:   Continue with pericarditis treatment with ibuprofen  800 mg 3 times daily with food, colchicine  0.6 mg twice daily -- send with 4 weeks--- cardiology to re-eval and extend if needed- Repeat echocardiogram outpatient in 1 week, with outpatient follow-up in 2 weeks.  Consider lupus work up outpatient: In the setting of chronic anemia, elevated LFTs, Cr, pericardial and pleural effusions  CMP, CBC 1 week   Discharge Diagnosis:   Principal Problem:   Pericardial effusion Active Problems:   Chest pain   Pleural effusion   Anemia   Acute pericarditis    Discharge Condition: Improved.  Diet recommendation: Low sodium, heart healthy  Wound care: None.  Code status: Full.   History of Present Illness:   Jeanette Bentley is a 68 y.o. female with medical history significant of anemia, hyperlipidemia seen at urgent care yesterday for headaches, bilateral ear pain/fullness, chest pain, and cough.  She was started on empiric treatment for bacterial URI with amoxicillin .  Patient presented to [facility] ED yesterday for evaluation of 2 episodes of exertional syncope at home. Vital signs on arrival to the ED: Temperature 98.9 F, pulse 112, respiratory rate 13, blood pressure 110/75, and SpO2 95% on room air.  Labs notable for WBC count 12.2, hemoglobin 10.1, MCV 79.8, platelet count 470k, glucose 115, creatinine 1.0, troponin negative x 2, COVID/influenza/RSV PCR negative.  CTA chest negative for PE but showing moderate pericardial effusion, small bilateral pleural effusions, and linear atelectasis or scarring in the lung bases.  CT head showing no acute intracranial abnormalities.  CT C-spine negative for acute  fracture.  X-ray of right shoulder negative for acute fracture or dislocation.  X-ray of left hip/pelvis negative for acute or traumatic finding. ED physician discussed the case with cardiology fellow Dr. Otelia who did not feel strongly that patient had cardiac tamponade.  Recommended medical admission and formal echocardiogram in the morning.  Patient was transferred from [facility] to [facility] ED and [facility] called to admit.  Patient received morphine , Zofran , and 1 L normal saline in the ED.     Patient states she has not been feeling well for the past week.  Having generalized weakness/fatigue, slight headaches, and chills.  It has been difficult for her to sleep at night because she has been experiencing pain in her chest whenever she lays down flat to sleep.  Yesterday she was climbing stairs at home when she felt very tired and dyspneic.  As soon as she reached upstairs, she passed out in the bathroom and hit the back of her head.  Then again later in the day she felt short of breath and tired as she was walking to her mailbox and passed out again and fell.  She is endorsing some discomfort in her right shoulder.  Denies cough, nausea, vomiting, abdominal pain, or diarrhea.     Hospital Course by Problem:   Acute pericarditis: Symptoms, EKG, echocardiogram, elevated ESR/CRP all suggestive of acute pericarditis. Idiopathic viral etiology most likely in setting of recent URI/viral illness -cards consulted:  ibuprofen  800 mg 3 times daily with PPI, along with colchicine  0.6 mg twice daily. -repeat limited echo again  2/6 -may need lupus work up outpatient    syncope  - cards recommends outpatient 2-week Zio  patch monitor to rule out any arrhythmias, given her syncope.   AKI, LFTs: -trending down   Right shoulder pain: Chronic degenerative changes, no fracture or dislocation. -voltaren  gel added    Medical Consultants:  cards    Discharge Exam:   Vitals:   06/06/23 0317 06/06/23  0710  BP: 130/81 127/77  Pulse: 90 88  Resp: 20 20  Temp: 98.3 F (36.8 C) 98.1 F (36.7 C)  SpO2: 96% 97%   Vitals:   06/05/23 1908 06/05/23 2323 06/06/23 0317 06/06/23 0710  BP: 131/82 136/72 130/81 127/77  Pulse: 96 85 90 88  Resp: 20 16 20 20   Temp: 98.4 F (36.9 C) 98.5 F (36.9 C) 98.3 F (36.8 C) 98.1 F (36.7 C)  TempSrc: Oral Oral Oral Oral  SpO2: 97% 96% 96% 97%  Weight:      Height:        General exam: Appears calm and comfortable.   The results of significant diagnostics from this hospitalization (including imaging, microbiology, ancillary and laboratory) are listed below for reference.     Procedures and Diagnostic Studies:   ECHOCARDIOGRAM COMPLETE Result Date: 06/03/2023    ECHOCARDIOGRAM REPORT   Patient Name:   Jeanette Bentley Date of Exam: 06/03/2023 Medical Rec #:  [id number]             Height:       65.0 in Accession #:    [id number]            Weight:       180.0 lb Date of Birth:  November 23, 1955             BSA:          1.892 m Patient Age:    67 years              BP:           120/78 mmHg Patient Gender: F                     HR:           85 bpm. Exam Location:  Inpatient Procedure: 2D Echo, Cardiac Doppler and Color Doppler Indications:    Pericardial effusion  History:        Patient has no prior history of Echocardiogram examinations.                 Signs/Symptoms:Chest Pain.  Sonographer:    Juanita Shaw Referring Phys: VASUNDHRA RATHORE IMPRESSIONS  1. Left ventricular ejection fraction, by estimation, is 60 to 65%. The left ventricle has normal function. The left ventricle has no regional wall motion abnormalities. Left ventricular diastolic parameters are consistent with Grade I diastolic dysfunction (impaired relaxation).  2. Right ventricular systolic function is normal. The right ventricular size is normal.  3. Large pericardial effusion. The pericardial effusion is circumferential. There is no evidence of cardiac tamponade.  4. The mitral valve  is normal in structure. Trivial mitral valve regurgitation. No evidence of mitral stenosis.  5. The aortic valve is tricuspid. Aortic valve regurgitation is trivial.  6. The inferior vena cava is dilated in size with <50% respiratory variability, suggesting right atrial pressure of 15 mmHg. Conclusion(s)/Recommendation(s): Although there is no echo current evidence of tamponade, right atrial pressure is high (dilated IVC). Recommend serial echo follow up. FINDINGS  Left Ventricle: Left ventricular ejection fraction, by estimation, is 60 to 65%. The left ventricle has normal function. The left ventricle has no  regional wall motion abnormalities. The left ventricular internal cavity size was normal in size. There is  no left ventricular hypertrophy. Left ventricular diastolic parameters are consistent with Grade I diastolic dysfunction (impaired relaxation). Right Ventricle: The right ventricular size is normal. No increase in right ventricular wall thickness. Right ventricular systolic function is normal. Left Atrium: Left atrial size was normal in size. Right Atrium: Right atrial size was normal in size. Pericardium: A large pericardial effusion is present. The pericardial effusion is circumferential. There is no evidence of cardiac tamponade. Mitral Valve: The mitral valve is normal in structure. Trivial mitral valve regurgitation. No evidence of mitral valve stenosis. MV peak gradient, 2.3 mmHg. The mean mitral valve gradient is 1.0 mmHg. Tricuspid Valve: The tricuspid valve is normal in structure. Tricuspid valve regurgitation is not demonstrated. Aortic Valve: The aortic valve is tricuspid. Aortic valve regurgitation is trivial. Aortic valve mean gradient measures 2.0 mmHg. Aortic valve peak gradient measures 3.0 mmHg. Aortic valve area, by VTI measures 3.00 cm. Pulmonic Valve: The pulmonic valve was grossly normal. Pulmonic valve regurgitation is trivial. No evidence of pulmonic stenosis. Aorta: The aortic  root is normal in size and structure. Venous: The inferior vena cava is dilated in size with less than 50% respiratory variability, suggesting right atrial pressure of 15 mmHg. IAS/Shunts: No atrial level shunt detected by color flow Doppler.  LEFT VENTRICLE PLAX 2D LVIDd:         3.70 cm      Diastology LVIDs:         2.40 cm      LV e' medial:    6.85 cm/s LV PW:         1.20 cm      LV E/e' medial:  7.3 LV IVS:        0.90 cm      LV e' lateral:   7.29 cm/s LVOT diam:     1.80 cm      LV E/e' lateral: 6.8 LV SV:         37 LV SV Index:   20 LVOT Area:     2.54 cm  LV Volumes (MOD) LV vol d, MOD A2C: 107.0 ml LV vol d, MOD A4C: 122.0 ml LV vol s, MOD A2C: 43.8 ml LV vol s, MOD A4C: 35.5 ml LV SV MOD A2C:     63.2 ml LV SV MOD A4C:     122.0 ml LV SV MOD BP:      75.0 ml RIGHT VENTRICLE             IVC RV Basal diam:  3.10 cm     IVC diam: 2.60 cm RV Mid diam:    2.80 cm RV S prime:     11.60 cm/s TAPSE (M-mode): 3.0 cm LEFT ATRIUM           Index        RIGHT ATRIUM           Index LA diam:      2.00 cm 1.06 cm/m   RA Area:     10.40 cm LA Vol (A2C): 42.9 ml 22.68 ml/m  RA Volume:   22.40 ml  11.84 ml/m LA Vol (A4C): 23.4 ml 12.37 ml/m  AORTIC VALVE                    PULMONIC VALVE AV Area (Vmax):    2.61 cm     PV Vmax:  0.75 m/s AV Area (Vmean):   2.38 cm     PV Peak grad:     2.3 mmHg AV Area (VTI):     3.00 cm     PR End Diast Vel: 3.26 msec AV Vmax:           87.30 cm/s AV Vmean:          58.800 cm/s AV VTI:            0.123 m AV Peak Grad:      3.0 mmHg AV Mean Grad:      2.0 mmHg LVOT Vmax:         89.40 cm/s LVOT Vmean:        54.900 cm/s LVOT VTI:          0.145 m LVOT/AV VTI ratio: 1.18  AORTA Ao Root diam: 3.40 cm Ao Asc diam:  3.20 cm MITRAL VALVE MV Area (PHT): 4.29 cm    SHUNTS MV Area VTI:   2.22 cm    Systemic VTI:  0.14 m MV Peak grad:  2.3 mmHg    Systemic Diam: 1.80 cm MV Mean grad:  1.0 mmHg MV Vmax:       0.76 m/s MV Vmean:      53.5 cm/s MV Decel Time: 177 msec MV E  velocity: 49.80 cm/s MV A velocity: 65.50 cm/s MV E/A ratio:  0.76 Mihai Croitoru MD Electronically signed by Jerel Balding MD Signature Date/Time: 06/03/2023/2:52:16 PM    Final    DG Hip Unilat W or Wo Pelvis 2-3 Views Left Result Date: 06/02/2023 CLINICAL DATA:  Left hip pain after a fall EXAM: DG HIP (WITH OR WITHOUT PELVIS) 2-3V LEFT COMPARISON:  None Available. FINDINGS: Mild bilateral osteoarthritis of the hips. No evidence of acute fracture or dislocation. IMPRESSION: No acute or traumatic finding. Mild bilateral osteoarthritis of the hips. Electronically Signed   By: Oneil Officer M.D.   On: 06/02/2023 21:52   DG Shoulder Right Result Date: 06/02/2023 CLINICAL DATA:  Right shoulder pain. Generalized weakness with 2 falls today. EXAM: RIGHT SHOULDER - 2+ VIEW COMPARISON:  None Available. FINDINGS: Degenerative changes in the acromioclavicular and glenohumeral joints with prominent subacromial and humeral osteophyte formation. Bone spurs demonstrate near bone on bone appearance at the subacromial space. No evidence of acute fracture or dislocation. No focal bone lesion or bone destruction. Soft tissues are unremarkable. IMPRESSION: Prominent degenerative changes in the right shoulder. No acute displaced fractures identified. Electronically Signed   By: Elsie Gravely M.D.   On: 06/02/2023 21:52   CT Angio Chest PE W and/or Wo Contrast Result Date: 06/02/2023 CLINICAL DATA:  Pulmonary embolus suspected with high probability. Generalized weakness and falls today. Shortness of breath and chest pressure. Recent diagnosis of upper respiratory infection on amoxicillin . EXAM: CT ANGIOGRAPHY CHEST WITH CONTRAST TECHNIQUE: Multidetector CT imaging of the chest was performed using the standard protocol during bolus administration of intravenous contrast. Multiplanar CT image reconstructions and MIPs were obtained to evaluate the vascular anatomy. RADIATION DOSE REDUCTION: This exam was performed according to the  departmental dose-optimization program which includes automated exposure control, adjustment of the mA and/or kV according to patient size and/or use of iterative reconstruction technique. CONTRAST:  75mL OMNIPAQUE  IOHEXOL  350 MG/ML SOLN COMPARISON:  Chest radiograph 06/02/2023 FINDINGS: Cardiovascular: Technically adequate study with good opacification of the central and segmental pulmonary arteries. Mild motion and streak artifact. No focal filling defects. No evidence of significant pulmonary embolus. Normal heart size. Moderate  pericardial effusion. Normal caliber thoracic aorta. No aortic dissection. Great vessel origins are patent. Mediastinum/Nodes: No enlarged mediastinal, hilar, or axillary lymph nodes. Thyroid gland, trachea, and esophagus demonstrate no significant findings. Lungs/Pleura: Small bilateral pleural effusions. Bandlike opacities in the lung bases likely represent scarring or atelectasis. No airspace disease or consolidation. Upper Abdomen: No acute abnormalities. Musculoskeletal: Degenerative changes in the spine. No acute bony abnormalities. Review of the MIP images confirms the above findings. IMPRESSION: 1. No evidence of significant pulmonary embolus. 2. Moderate pericardial effusion. 3. Small bilateral pleural effusions. Linear atelectasis or scarring in the lung bases. Electronically Signed   By: Elsie Gravely M.D.   On: 06/02/2023 21:36   CT Cervical Spine Wo Contrast Result Date: 06/02/2023 CLINICAL DATA:  Neck trauma. Generalized weakness with 2 falls today. EXAM: CT CERVICAL SPINE WITHOUT CONTRAST TECHNIQUE: Multidetector CT imaging of the cervical spine was performed without intravenous contrast. Multiplanar CT image reconstructions were also generated. RADIATION DOSE REDUCTION: This exam was performed according to the departmental dose-optimization program which includes automated exposure control, adjustment of the mA and/or kV according to patient size and/or use of  iterative reconstruction technique. COMPARISON:  Cervical spine radiographs 09/15/2012 FINDINGS: Alignment: Normal alignment. Skull base and vertebrae: No vertebral compression deformities. No focal bone lesion or bone destruction. Bone cortex appears intact. Skull base appears intact. Soft tissues and spinal canal: No prevertebral soft tissue swelling. No abnormal paraspinal soft tissue mass or infiltration. Disc levels: Degenerative changes with disc space narrowing and endplate osteophyte formation throughout the cervical spine. Degenerative changes in the facet joints. Upper chest: Lung apices are clear. Other: None. IMPRESSION: Normal alignment of the cervical spine. Mild degenerative changes. No acute displaced fractures identified. Electronically Signed   By: Elsie Gravely M.D.   On: 06/02/2023 21:32   CT Head Wo Contrast Result Date: 06/02/2023 CLINICAL DATA:  Minor head trauma. Generalized weakness and falls x2 today. EXAM: CT HEAD WITHOUT CONTRAST TECHNIQUE: Contiguous axial images were obtained from the base of the skull through the vertex without intravenous contrast. RADIATION DOSE REDUCTION: This exam was performed according to the departmental dose-optimization program which includes automated exposure control, adjustment of the mA and/or kV according to patient size and/or use of iterative reconstruction technique. COMPARISON:  None Available. FINDINGS: Brain: No evidence of acute infarction, hemorrhage, hydrocephalus, extra-axial collection or mass lesion/mass effect. Vascular: No hyperdense vessel or unexpected calcification. Skull: Normal. Negative for fracture or focal lesion. Sinuses/Orbits: Paranasal sinuses and mastoid air cells are clear. Other: None. IMPRESSION: No acute intracranial abnormalities. Electronically Signed   By: Elsie Gravely M.D.   On: 06/02/2023 21:29   DG Chest 2 View Result Date: 06/02/2023 CLINICAL DATA:  Upper respiratory infection. EXAM: CHEST - 2 VIEW  COMPARISON:  None Available. FINDINGS: There are bibasilar streaky atelectasis or scarring. Pneumonia is not excluded. Blunting of the costophrenic angles may be scarring or represent trace pleural effusion. No pneumothorax. The cardiac silhouette is within normal limits. Atherosclerotic calcification of the aorta. No acute osseous pathology. IMPRESSION: Bibasilar atelectasis or scarring. Pneumonia is not excluded. Electronically Signed   By: Vanetta Chou M.D.   On: 06/02/2023 18:24     Labs:   Basic Metabolic Panel: Recent Labs  Lab 06/02/23 1635 06/03/23 0618 06/04/23 0411 06/05/23 0207 06/06/23 0209  NA 136 140 141 141 140  K 3.8 3.8 4.1 3.7 3.4*  CL 102 107 109 108 104  CO2 24 24 24 23 24   GLUCOSE 115* 105* 112* 93 91  BUN 15 13 20 12 11   CREATININE 1.06* 1.18* 1.34* 0.92 1.03*  CALCIUM 9.2 8.6* 8.6* 8.5* 8.5*   GFR Estimated Creatinine Clearance: 58.6 mL/min (A) (by C-G formula based on SCr of 1.03 mg/dL (H)). Liver Function Tests: Recent Labs  Lab 06/03/23 0618 06/04/23 0411 06/05/23 0207 06/06/23 0209  AST 91* 206* 64* 28  ALT 100* 150* 107* 76*  ALKPHOS 86 127* 114 103  BILITOT 0.5 0.6 0.4 0.3  PROT 6.0* 6.1* 6.0* 6.1*  ALBUMIN 2.9* 2.8* 2.6* 2.6*   No results for input(s): LIPASE, AMYLASE in the last 168 hours. No results for input(s): AMMONIA in the last 168 hours. Coagulation profile No results for input(s): INR, PROTIME in the last 168 hours.  CBC: Recent Labs  Lab 06/02/23 1635 06/03/23 0618 06/04/23 1131 06/05/23 0207 06/06/23 0209  WBC 12.2* 9.4 8.4 9.1 8.1  HGB 10.1* 8.7* 8.3* 8.6* 8.4*  HCT 28.1* 24.6* 23.3* 23.5* 23.3*  MCV 79.8* 81.2 80.9 79.1* 79.3*  PLT 470* 387 383 423* 444*   Cardiac Enzymes: No results for input(s): CKTOTAL, CKMB, CKMBINDEX, TROPONINI in the last 168 hours. BNP: Invalid input(s): POCBNP CBG: No results for input(s): GLUCAP in the last 168 hours. D-Dimer No results for input(s): DDIMER  in the last 72 hours. Hgb A1c No results for input(s): HGBA1C in the last 72 hours. Lipid Profile No results for input(s): CHOL, HDL, LDLCALC, TRIG, CHOLHDL, LDLDIRECT in the last 72 hours. Thyroid function studies No results for input(s): TSH, T4TOTAL, T3FREE, THYROIDAB in the last 72 hours.  Invalid input(s): FREET3 Anemia work up No results for input(s): VITAMINB12, FOLATE, FERRITIN, TIBC, IRON, RETICCTPCT in the last 72 hours. Microbiology Recent Results (from the past 240 hours)  Resp panel by RT-PCR (RSV, Flu A&B, Covid) Anterior Nasal Swab     Status: None   Collection Time: 06/02/23  5:20 PM   Specimen: Anterior Nasal Swab  Result Value Ref Range Status   SARS Coronavirus 2 by RT PCR NEGATIVE NEGATIVE Final    Comment: (NOTE) SARS-CoV-2 target nucleic acids are NOT DETECTED.  The SARS-CoV-2 RNA is generally detectable in upper respiratory specimens during the acute phase of infection. The lowest concentration of SARS-CoV-2 viral copies this assay can detect is 138 copies/mL. A negative result does not preclude SARS-Cov-2 infection and should not be used as the sole basis for treatment or other patient management decisions. A negative result may occur with  improper specimen collection/handling, submission of specimen other than nasopharyngeal swab, presence of viral mutation(s) within the areas targeted by this assay, and inadequate number of viral copies(<138 copies/mL). A negative result must be combined with clinical observations, patient history, and epidemiological information. The expected result is Negative.  Fact Sheet for Patients:  bloggercourse.com  Fact Sheet for Healthcare Providers:  seriousbroker.it  This test is no t yet approved or cleared by the [place]  FDA and  has been authorized for detection and/or diagnosis of SARS-CoV-2 by FDA under an Emergency Use  Authorization (EUA). This EUA will remain  in effect (meaning this test can be used) for the duration of the COVID-19 declaration under Section 564(b)(1) of the Act, 21 U.S.C.section 360bbb-3(b)(1), unless the authorization is terminated  or revoked sooner.       Influenza A by PCR NEGATIVE NEGATIVE Final   Influenza B by PCR NEGATIVE NEGATIVE Final    Comment: (NOTE) The Xpert Xpress SARS-CoV-2/FLU/RSV plus assay is intended as an aid in the diagnosis of influenza from Nasopharyngeal swab  specimens and should not be used as a sole basis for treatment. Nasal washings and aspirates are unacceptable for Xpert Xpress SARS-CoV-2/FLU/RSV testing.  Fact Sheet for Patients: bloggercourse.com  Fact Sheet for Healthcare Providers: seriousbroker.it  This test is not yet approved or cleared by the [place]  FDA and has been authorized for detection and/or diagnosis of SARS-CoV-2 by FDA under an Emergency Use Authorization (EUA). This EUA will remain in effect (meaning this test can be used) for the duration of the COVID-19 declaration under Section 564(b)(1) of the Act, 21 U.S.C. section 360bbb-3(b)(1), unless the authorization is terminated or revoked.     Resp Syncytial Virus by PCR NEGATIVE NEGATIVE Final    Comment: (NOTE) Fact Sheet for Patients: bloggercourse.com  Fact Sheet for Healthcare Providers: seriousbroker.it  This test is not yet approved or cleared by the [place]  FDA and has been authorized for detection and/or diagnosis of SARS-CoV-2 by FDA under an Emergency Use Authorization (EUA). This EUA will remain in effect (meaning this test can be used) for the duration of the COVID-19 declaration under Section 564(b)(1) of the Act, 21 U.S.C. section 360bbb-3(b)(1), unless the authorization is terminated or revoked.  Performed at [facility], [address], [place], [place] [address]      Discharge Instructions:   Discharge Instructions     Diet general   Complete by: As directed    Discharge instructions   Complete by: As directed    No strenuous exercise. Okay to to ADLs, leisurely walk Echo 1 week Cards follow up 2 weeks   Increase activity slowly   Complete by: As directed       Allergies as of 06/06/2023   No Known Allergies      Medication List     STOP taking these medications    amoxicillin  875 MG tablet Commonly known as: AMOXIL    pseudoephedrine  30 MG tablet Commonly known as: SUDAFED       TAKE these medications    Astragalus Root 470 MG Caps 2 capsules (5,000 MG total) Orally once a day   Beet Root 500 MG Caps Take 1,000 mg by mouth daily.   carbamide peroxide 6.5 % OTIC solution Commonly known as: DEBROX Place 5 drops into both ears daily as needed.   cetirizine  10 MG tablet Commonly known as: ZyrTEC  Allergy Take 1 tablet (10 mg total) by mouth daily.   colchicine  0.6 MG tablet Take 1 tablet (0.6 mg total) by mouth 2 (two) times daily.   cyanocobalamin  1000 MCG tablet Take 1 tablet (1,000 mcg total) by mouth daily. Start taking on: June 07, 2023   Ester-C Tabs Take 2 tablets by mouth daily.   Fluocinolone  Acetonide 0.01 % Oil Commonly known as: DermOtic  Place 5 drops in ear(s) 2 (two) times daily as needed (Itching in ears).   ibuprofen  800 MG tablet Commonly known as: ADVIL  Take 1 tablet (800 mg total) by mouth 3 (three) times daily. What changed:  medication strength how much to take when to take this reasons to take this   Krill Oil 1000 MG Caps Take 2,000 mg by mouth daily.   OVER THE COUNTER MEDICATION Take 2 capsules by mouth daily. Recovery   pantoprazole  40 MG tablet Commonly known as: PROTONIX  Take 1 tablet (40 mg total) by mouth daily. Start taking on: June 07, 2023   Red Yeast Rice 600 MG Caps Take 4 capsules by mouth daily.    saccharomyces boulardii 250 MG capsule Commonly known as:  FLORASTOR Take 250 mg by mouth daily.   Vitamin D-3 125 MCG (5000 UT) Tabs Take 5,000 Units by mouth daily.   WHOLE GRAIN BARLEY(DIAGNOSTIC) IJ Take 1 Scoop by mouth daily.        Follow-up Information     Wyn Jackee VEAR Mickey., NP Follow up on 06/20/2023.   Specialty: Cardiology Why: at 8:25am for your cardiology follow up appointment Contact information: [address] [place] [address] [phone]         Jeanette Pellet, MD Follow up in 1 week(s).   Specialty: Family Medicine Contact information: [address], [address] [place] [address] [phone]                  Time coordinating discharge: 45 min  Signed:  Harlene RAYMOND Bowl DO  [facility] 06/06/2023, 10:30 AM

## 2023-06-06 NOTE — Progress Notes (Cosign Needed)
 Cardiology follow up arranged 2/21, Echo in 1 week

## 2023-06-06 NOTE — Progress Notes (Signed)
   Patient Name: Jeanette Bentley Date of Encounter: 06/06/2023 [facility] Cardiologist: None   Interval Summary  .    No chest pain No lightheadedness Feels well Ambulated without any complaints of lightheadedness, dizziness  Echo 2/6: Improvement in pericardial effusion, small to moderate normal, IVC remains dilated with no collapse.    Vital Signs .    Vitals:   06/05/23 1908 06/05/23 2323 06/06/23 0317 06/06/23 0710  BP: 131/82 136/72 130/81 127/77  Pulse: 96 85 90 88  Resp: 20 16 20 20   Temp: 98.4 F (36.9 C) 98.5 F (36.9 C) 98.3 F (36.8 C) 98.1 F (36.7 C)  TempSrc: Oral Oral Oral Oral  SpO2: 97% 96% 96% 97%  Weight:      Height:        Intake/Output Summary (Last 24 hours) at 06/06/2023 1124 Last data filed at 06/05/2023 1807 Gross per 24 hour  Intake 460 ml  Output --  Net 460 ml      06/04/2023    4:53 PM 06/03/2023    9:05 AM 10/13/2013    2:33 PM  Last 3 Weights  Weight (lbs) 184 lb 14.4 oz 180 lb 191 lb 6.4 oz  Weight (kg) 83.87 kg 81.647 kg 86.818 kg      Telemetry/ECG    No arrhythmia Sinus tachycardia with minimal activity  Physical Exam .   Physical Exam Vitals and nursing note reviewed.  Constitutional:      General: She is not in acute distress. Neck:     Vascular: JVD present.  Cardiovascular:     Rate and Rhythm: Normal rate and regular rhythm.     Heart sounds: Normal heart sounds. No murmur heard. Pulmonary:     Effort: Pulmonary effort is normal.     Breath sounds: Normal breath sounds. No wheezing or rales.  Musculoskeletal:     Right lower leg: No edema.     Left lower leg: No edema.      Assessment & Plan .     68 year old female with hyperlipidemia, anemia, recent URI, acute pericarditis with pericardial effusion   Acute pericarditis: Symptoms, EKG, echocardiogram, elevated ESR/CRP all suggestive of acute pericarditis. Idiopathic viral etiology most likely Circumferential pericardial effusion  improving, now small, improved. Dilated IVC, but no tamponade. She is stable for discharge today. Continue with pericarditis treatment with ibuprofen  800 mg 3 times daily with food, colchicine  0.6 mg twice daily. Continue PPI. Repeat echocardiogram outpatient in 1 week, with outpatient follow-up in 2 weeks. Avoid strenuous physical activity, okay to continue ADLs and light walk.  In the setting of chronic anemia, elevated LFTs, Cr, pericardial and pleural effusions, she may warrant with outpatient evaluation for lupus.   For questions or updates, please contact [facility] Please consult [email or web address] for contact info under        Signed, Newman JINNY Lawrence, MD

## 2023-06-13 ENCOUNTER — Ambulatory Visit (HOSPITAL_COMMUNITY)
Admit: 2023-06-13 | Discharge: 2023-06-13 | Disposition: A | Discharge status: Home / Self Care | Payer: Medicare Other | Source: Ambulatory Visit | Attending: Cardiology | Admitting: Cardiology

## 2023-06-13 DIAGNOSIS — I371 Nonrheumatic pulmonary valve insufficiency: Secondary | ICD-10-CM | POA: Insufficient documentation

## 2023-06-13 DIAGNOSIS — I319 Disease of pericardium, unspecified: Secondary | ICD-10-CM

## 2023-06-13 DIAGNOSIS — I3139 Other pericardial effusion (noninflammatory): Secondary | ICD-10-CM | POA: Insufficient documentation

## 2023-06-13 DIAGNOSIS — E785 Hyperlipidemia, unspecified: Secondary | ICD-10-CM | POA: Insufficient documentation

## 2023-06-13 LAB — ECHOCARDIOGRAM COMPLETE
AR max vel: 2.68 cm2
AV Area VTI: 2.68 cm2
AV Area mean vel: 2.59 cm2
AV Mean grad: 4 mm[Hg]
AV Peak grad: 6.6 mm[Hg]
Ao pk vel: 1.28 m/s
Area-P 1/2: 4.29 cm2
S' Lateral: 2.7 cm

## 2023-06-17 NOTE — Progress Notes (Signed)
 Good news.  There is minimal effusion without any signs of tamponade.  This is certainly improved compared to hospital echocardiograms.  Reasonable to limit colchicine therapy to 3 months, and high-dose ibuprofen therapy to 6 weeks.  Patient seeing you soon for hospital follow-up.  Thanks MJP

## 2023-06-18 NOTE — Progress Notes (Unsigned)
  Cardiology Office Note    Patient Name: Jeanette Bentley Date of Encounter: 06/18/2023  Primary Care Provider:  Merri Brunette, MD Primary Cardiologist:  None Primary Electrophysiologist: None   Past Medical History    Past Medical History:  Diagnosis Date   Anemia    Hemoglobin C (Hb-C) (HCC)    Hyperlipidemia     History of Present Illness  Jeanette Bentley is a 68 y.o. female with a PMH of***   During today's visit the patient reports*** .  Patient denies chest pain, palpitations, dyspnea, PND, orthopnea, nausea, vomiting, dizziness, syncope, edema, weight gain, or early satiety.  ***Notes: -Good news.  There is minimal effusion without any signs of tamponade.  This is certainly improved compared to hospital echocardiograms.   Reasonable to limit colchicine therapy to 3 months, and high-dose ibuprofen therapy to 6 weeks.   Patient seeing you soon for hospital follow-up.  -Last ischemic evaluation: -Last echo: -Interim ED visits: Review of Systems  Please see the history of present illness.    All other systems reviewed and are otherwise negative except as noted above.  Physical Exam    Wt Readings from Last 3 Encounters:  06/04/23 184 lb 14.4 oz (83.9 kg)  10/13/13 191 lb 6.4 oz (86.8 kg)  09/15/12 201 lb (91.2 kg)   WU:JWJXB were no vitals filed for this visit.,There is no height or weight on file to calculate BMI. GEN: Well nourished, well developed in no acute distress Neck: No JVD; No carotid bruits Pulmonary: Clear to auscultation without rales, wheezing or rhonchi  Cardiovascular: Normal rate. Regular rhythm. Normal S1. Normal S2.   Murmurs: There is no murmur.  ABDOMEN: Soft, non-tender, non-distended EXTREMITIES:  No edema; No deformity   EKG/LABS/ Recent Cardiac Studies   ECG personally reviewed by me today - ***  Risk Assessment/Calculations:   {Does this patient have ATRIAL FIBRILLATION?[phone]}      Lab Results  Component  Value Date   WBC 8.1 06/06/2023   HGB 8.4 (L) 06/06/2023   HCT 23.3 (L) 06/06/2023   MCV 79.3 (L) 06/06/2023   PLT 444 (H) 06/06/2023   Lab Results  Component Value Date   CREATININE 1.03 (H) 06/06/2023   BUN 11 06/06/2023   NA 140 06/06/2023   K 3.4 (L) 06/06/2023   CL 104 06/06/2023   CO2 24 06/06/2023   No results found for: "CHOL", "HDL", "LDLCALC", "LDLDIRECT", "TRIG", "CHOLHDL"  No results found for: "HGBA1C" Assessment & Plan    1.***  2.***  3.***  4.***      Disposition: Follow-up with None or APP in *** months {Are you ordering a CV Procedure (e.g. stress test, cath, DCCV, TEE, etc)?   Press F2        :[id number]}   Signed, Napoleon Form, Leodis Rains, NP 06/18/2023, 4:31 PM [facility]

## 2023-06-20 ENCOUNTER — Ambulatory Visit: Payer: Medicare Other | Attending: Nurse Practitioner | Admitting: Nurse Practitioner

## 2023-06-20 ENCOUNTER — Other Ambulatory Visit (HOSPITAL_COMMUNITY): Payer: Self-pay

## 2023-06-20 ENCOUNTER — Encounter: Payer: Self-pay | Admitting: Nurse Practitioner

## 2023-06-20 ENCOUNTER — Ambulatory Visit (INDEPENDENT_AMBULATORY_CARE_PROVIDER_SITE_OTHER): Payer: Medicare Other

## 2023-06-20 VITALS — BP 134/70 | HR 78 | Ht 66.0 in | Wt 176.2 lb

## 2023-06-20 DIAGNOSIS — I319 Disease of pericardium, unspecified: Secondary | ICD-10-CM | POA: Diagnosis not present

## 2023-06-20 DIAGNOSIS — E785 Hyperlipidemia, unspecified: Secondary | ICD-10-CM | POA: Insufficient documentation

## 2023-06-20 DIAGNOSIS — R55 Syncope and collapse: Secondary | ICD-10-CM | POA: Diagnosis not present

## 2023-06-20 DIAGNOSIS — D509 Iron deficiency anemia, unspecified: Secondary | ICD-10-CM | POA: Diagnosis present

## 2023-06-20 LAB — CBC

## 2023-06-20 NOTE — Progress Notes (Unsigned)
 Applied a 14 day Zio XT monitor to patient in the office  [place] to read

## 2023-06-20 NOTE — Patient Instructions (Signed)
 Medication Instructions:  Discontinue Ibuprofen March 17th Discontinue Colchicine May 2nd Discontinue Protonics March 17th Can take Imodium for constipation  *If you need a refill on your cardiac medications before your next appointment, please call your pharmacy*   Lab Work: CBC, CMET If you have labs (blood work) drawn today and your tests are completely normal, you will receive your results only by: MyChart Message (if you have MyChart) OR A paper copy in the mail If you have any lab test that is abnormal or we need to change your treatment, we will call you to review the results.   Testing/Procedures: ECHO in 3 months Your physician has requested that you have an echocardiogram. Echocardiography is a painless test that uses sound waves to create images of your heart. It provides your doctor with information about the size and shape of your heart and how well your heart's chambers and valves are working. This procedure takes approximately one hour. There are no restrictions for this procedure. Please do NOT wear cologne, perfume, aftershave, or lotions (deodorant is allowed). Please arrive 15 minutes prior to your appointment time.  Please note: We ask at that you not bring children with you during ultrasound (echo/ vascular) testing. Due to room size and safety concerns, children are not allowed in the ultrasound rooms during exams. Our front office staff cannot provide observation of children in our lobby area while testing is being conducted. An adult accompanying a patient to their appointment will only be allowed in the ultrasound room at the discretion of the ultrasound technician under special circumstances. We apologize for any inconvenience.    2 WEEK ZIO HEART MONITOR Your physician has requested that you wear a Zio heart monitor for 14 days. This will be mailed to your home with instructions on how to apply the monitor and how to return it when finished. Please allow 2 weeks  after returning the heart monitor before our office calls you with the results.   ZIO XT- Long Term Monitor Instructions  Your physician has requested you wear a ZIO patch monitor for 14 days.  This is a single patch monitor. [employer] supplies one patch monitor per enrollment. Additional stickers are not available. Please do not apply patch if you will be having a Nuclear Stress Test,  Echocardiogram, Cardiac CT, MRI, or Chest Xray during the period you would be wearing the  monitor. The patch cannot be worn during these tests. You cannot remove and re-apply the  ZIO XT patch monitor.  Your ZIO patch monitor will be mailed 3 day USPS to your address on file. It may take 3-5 days  to receive your monitor after you have been enrolled.  Once you have received your monitor, please review the enclosed instructions. Your monitor  has already been registered assigning a specific monitor serial # to you.  Billing and Patient Assistance Program Information  We have supplied [employer] with any of your insurance information on file for billing purposes. [employer] offers a sliding scale Patient Assistance Program for patients that do not have  insurance, or whose insurance does not completely cover the cost of the ZIO monitor.  You must apply for the Patient Assistance Program to qualify for this discounted rate.  To apply, please call [employer] at [phone], select option 4, select option 2, ask to apply for  Patient Assistance Program. Meredeth Ide will ask your household income, and how many people  are in your household. They will quote your out-of-pocket cost based on that  information.  [employer] will also be able to set up a 80-month, interest-free payment plan if needed.  Applying the monitor   Shave hair from upper left chest.  Hold abrader disc by orange tab. Rub abrader in 40 strokes over the upper left chest as  indicated in your monitor instructions.  Clean area with 4 enclosed alcohol pads. Let  dry.  Apply patch as indicated in monitor instructions. Patch will be placed under collarbone on left  side of chest with arrow pointing upward.  Rub patch adhesive wings for 2 minutes. Remove white label marked "1". Remove the white  label marked "2". Rub patch adhesive wings for 2 additional minutes.  While looking in a mirror, press and release button in center of patch. A small green light will  flash 3-4 times. This will be your only indicator that the monitor has been turned on.  Do not shower for the first 24 hours. You may shower after the first 24 hours.  Press the button if you feel a symptom. You will hear a small click. Record Date, Time and  Symptom in the Patient Logbook.  When you are ready to remove the patch, follow instructions on the last 2 pages of Patient  Logbook. Stick patch monitor onto the last page of Patient Logbook.  Place Patient Logbook in the blue and white box. Use locking tab on box and tape box closed  securely. The blue and white box has prepaid postage on it. Please place it in the mailbox as  soon as possible. Your physician should have your test results approximately 7 days after the  monitor has been mailed back to [facility].  Call [facility] Customer Care at [phone] if you have questions regarding  your ZIO XT patch monitor. Call them immediately if you see an orange light blinking on your  monitor.  If your monitor falls off in less than 4 days, contact our Monitor department at [phone].  If your monitor becomes loose or falls off after 4 days call [employer] at [phone] for  suggestions on securing your monitor    Follow-Up: At [facility], you and your health needs are our priority.  As part of our continuing mission to provide you with exceptional heart care, we have created designated Provider Care Teams.  These Care Teams include your primary Cardiologist (physician) and Advanced Practice Providers (APPs -   Physician Assistants and Nurse Practitioners) who all work together to provide you with the care you need, when you need it.  We recommend signing up for the patient portal called "MyChart".  Sign up information is provided on this After Visit Summary.  MyChart is used to connect with patients for Virtual Visits (Telemedicine).  Patients are able to view lab/test results, encounter notes, upcoming appointments, etc.  Non-urgent messages can be sent to your provider as well.   To learn more about what you can do with MyChart, go to [email or web address].    Your next appointment:   4 month(s)  Provider:   Elder Negus, MD     Other Instructions No driving until heart monitor results come back (up to 6 months)

## 2023-06-21 LAB — CBC
Hematocrit: 35.6 % (ref 34.0–46.6)
Hemoglobin: 11.6 g/dL (ref 11.1–15.9)
MCH: 27.1 pg (ref 26.6–33.0)
MCHC: 32.6 g/dL (ref 31.5–35.7)
MCV: 83 fL (ref 79–97)
Platelets: 721 10*3/uL — ABNORMAL HIGH (ref 150–450)
RBC: 4.28 x10E6/uL (ref 3.77–5.28)
RDW: 13.7 % (ref 11.7–15.4)
WBC: 7.7 10*3/uL (ref 3.4–10.8)

## 2023-06-21 LAB — COMPREHENSIVE METABOLIC PANEL
ALT: 34 IU/L — ABNORMAL HIGH (ref 0–32)
AST: 38 IU/L (ref 0–40)
Albumin: 4.1 g/dL (ref 3.9–4.9)
Alkaline Phosphatase: 112 IU/L (ref 44–121)
BUN/Creatinine Ratio: 15 (ref 12–28)
BUN: 16 mg/dL (ref 8–27)
Bilirubin Total: 0.2 mg/dL (ref 0.0–1.2)
CO2: 26 mmol/L (ref 20–29)
Calcium: 9.5 mg/dL (ref 8.7–10.3)
Chloride: 101 mmol/L (ref 96–106)
Creatinine, Ser: 1.07 mg/dL — ABNORMAL HIGH (ref 0.57–1.00)
Globulin, Total: 3 g/dL (ref 1.5–4.5)
Glucose: 87 mg/dL (ref 70–99)
Potassium: 3.8 mmol/L (ref 3.5–5.2)
Sodium: 143 mmol/L (ref 134–144)
Total Protein: 7.1 g/dL (ref 6.0–8.5)
eGFR: 57 mL/min/{1.73_m2} — ABNORMAL LOW (ref >59)

## 2023-06-27 ENCOUNTER — Other Ambulatory Visit: Payer: Self-pay

## 2023-06-30 ENCOUNTER — Telehealth: Payer: Self-pay | Admitting: Nurse Practitioner

## 2023-06-30 ENCOUNTER — Other Ambulatory Visit: Payer: Self-pay

## 2023-06-30 NOTE — Telephone Encounter (Signed)
*  STAT* If patient is at the pharmacy, call can be transferred to refill team.   1. Which medications need to be refilled? (please list name of each medication and dose if known)  /l,  colchicine 0.6 MG tablet    ibuprofen (ADVIL) 800 MG tablet    pantoprazole (PROTONIX) 40 MG tablet   4. Which pharmacy/location (including street and city if local pharmacy) is medication to be sent to?  [id number] - [place], [place] - [address] AT [facility] [address] Phone: [phone]  Fax: [phone]       5. Do they need a 30 day or 90 day supply? 90

## 2023-07-01 MED ORDER — PANTOPRAZOLE SODIUM 40 MG PO TBEC
40.0000 mg | DELAYED_RELEASE_TABLET | Freq: Every day | ORAL | 0 refills | Status: DC
Start: 2023-07-01 — End: 2023-09-23

## 2023-07-01 MED ORDER — COLCHICINE 0.6 MG PO TABS: 0.6000 mg | ORAL_TABLET | Freq: Two times a day (BID) | ORAL | 0 refills | Status: DC

## 2023-07-01 MED ORDER — IBUPROFEN 800 MG PO TABS: 800.0000 mg | ORAL_TABLET | Freq: Three times a day (TID) | ORAL | 0 refills | Status: DC

## 2023-08-19 ENCOUNTER — Ambulatory Visit
Admission: RE | Admit: 2023-08-19 | Discharge: 2023-08-19 | Disposition: A | Discharge status: Home / Self Care | Payer: Medicare Other | Source: Ambulatory Visit | Attending: Family Medicine | Admitting: Family Medicine

## 2023-08-19 ENCOUNTER — Ambulatory Visit: Payer: Medicare Other

## 2023-08-19 DIAGNOSIS — Z1231 Encounter for screening mammogram for malignant neoplasm of breast: Secondary | ICD-10-CM

## 2023-09-17 ENCOUNTER — Ambulatory Visit (HOSPITAL_COMMUNITY)
Admission: RE | Admit: 2023-09-17 | Discharge: 2023-09-17 | Disposition: A | Discharge status: Home / Self Care | Payer: Medicare Other | Source: Ambulatory Visit | Attending: Nurse Practitioner | Admitting: Nurse Practitioner

## 2023-09-17 DIAGNOSIS — I319 Disease of pericardium, unspecified: Secondary | ICD-10-CM

## 2023-09-17 LAB — ECHOCARDIOGRAM COMPLETE
AR max vel: 2.88 cm2
AV Area VTI: 2.54 cm2
AV Area mean vel: 2.69 cm2
AV Mean grad: 3 mmHg
AV Peak grad: 5.6 mmHg
Ao pk vel: 1.18 m/s
Area-P 1/2: 2.92 cm2
P 1/2 time: 639 ms
S' Lateral: 2.4 cm

## 2023-09-18 ENCOUNTER — Telehealth: Payer: Self-pay | Admitting: Nurse Practitioner

## 2023-09-18 ENCOUNTER — Ambulatory Visit: Payer: Self-pay | Admitting: Nurse Practitioner

## 2023-09-18 NOTE — Telephone Encounter (Signed)
 Pt returning call to nurse for Echo results

## 2023-09-18 NOTE — Telephone Encounter (Signed)
 Spoke to Jeanette Bentley. Went over echo results. Jeanette Bentley wants to hold off on ordering CT at this time. She wants to speak to Dr. Filiberto Hug first. Jeanette Bentley has several questions, she will ask them the her appointment next week.

## 2023-09-18 NOTE — Progress Notes (Unsigned)
  Cardiology Office Note:  .   Date:  09/18/2023  ID:  Jeanette Bentley, DOB [birth year], MRN [id number] PCP: Faustina Hood, MD   HeartCare Providers Cardiologist:  Fransico Ivy, MD PCP: Faustina Hood, MD  No chief complaint on file.    Jeanette Bentley is a 68 y.o. female with *** Discussed the use of AI scribe software for clinical note transcription with the patient, who gave verbal consent to proceed.  History of Present Illness       There were no vitals filed for this visit.    ROS      Studies Reviewed: .        *** Independently interpreted ***/202***: Chol ***, TG ***, HDL ***, LDL *** HbA1C ***% Hb *** Cr *** ***  Risk Assessment/Calculations:   {Does this patient have ATRIAL FIBRILLATION?:[phone]}    Physical Exam   VISIT DIAGNOSES: No diagnosis found.   Jeanette Bentley is a 68 y.o. female with *** Assessment and Plan Assessment & Plan       {Are you ordering a CV Procedure (e.g. stress test, cath, DCCV, TEE, etc)?   Press F2        :[id number]}    No orders of the defined types were placed in this encounter.    F/u in ***  Signed, Cody Das, MD

## 2023-09-23 ENCOUNTER — Ambulatory Visit: Payer: Medicare Other | Attending: Cardiology | Admitting: Cardiology

## 2023-09-23 ENCOUNTER — Encounter: Payer: Self-pay | Admitting: Cardiology

## 2023-09-23 VITALS — BP 128/70 | HR 68 | Resp 16 | Ht 66.0 in | Wt 175.0 lb

## 2023-09-23 DIAGNOSIS — I77810 Thoracic aortic ectasia: Secondary | ICD-10-CM | POA: Insufficient documentation

## 2023-09-23 DIAGNOSIS — Z8679 Personal history of other diseases of the circulatory system: Secondary | ICD-10-CM | POA: Diagnosis not present

## 2023-09-23 DIAGNOSIS — E782 Mixed hyperlipidemia: Secondary | ICD-10-CM | POA: Insufficient documentation

## 2023-09-23 LAB — BASIC METABOLIC PANEL WITH GFR
BUN/Creatinine Ratio: 16 (ref 12–28)
BUN: 15 mg/dL (ref 8–27)
CO2: 26 mmol/L (ref 20–29)
Calcium: 9.7 mg/dL (ref 8.7–10.3)
Chloride: 102 mmol/L (ref 96–106)
Creatinine, Ser: 0.92 mg/dL (ref 0.57–1.00)
Glucose: 87 mg/dL (ref 70–99)
Potassium: 4.1 mmol/L (ref 3.5–5.2)
Sodium: 140 mmol/L (ref 134–144)
eGFR: 69 mL/min/{1.73_m2} (ref >59)

## 2023-09-23 LAB — LIPID PANEL
Chol/HDL Ratio: 2.6 ratio (ref 0.0–4.4)
Cholesterol, Total: 221 mg/dL — ABNORMAL HIGH (ref 100–199)
HDL: 85 mg/dL (ref >39)
LDL Chol Calc (NIH): 125 mg/dL — ABNORMAL HIGH (ref 0–99)
Triglycerides: 61 mg/dL (ref 0–149)
VLDL Cholesterol Cal: 11 mg/dL (ref 5–40)

## 2023-09-23 NOTE — Patient Instructions (Addendum)
 Lab Work: [employer] Lipid Panel   If you have labs (blood work) drawn today and your tests are completely normal, you will receive your results only by: MyChart Message (if you have MyChart) OR A paper copy in the mail If you have any lab test that is abnormal or we need to change your treatment, we will call you to review the results.  Testing/Procedures: CTA AORTA   Non-Cardiac CT Angiography (CTA), is a special type of CT scan that uses a computer to produce multi-dimensional views of major blood vessels throughout the body. In CT angiography, a contrast material is injected through an IV to help visualize the blood vessels   Follow-Up: At [facility], you and your health needs are our priority.  As part of our continuing mission to provide you with exceptional heart care, our providers are all part of one team.  This team includes your primary Cardiologist (physician) and Advanced Practice Providers or APPs (Physician Assistants and Nurse Practitioners) who all work together to provide you with the care you need, when you need it.  Your next appointment:   As needed  Provider:   Cody Das, MD

## 2023-09-24 ENCOUNTER — Ambulatory Visit: Payer: Self-pay | Admitting: Cardiology

## 2023-09-24 NOTE — Progress Notes (Signed)
 Cholesterol remains unchanged.  No calcium in coronary arteries, but 10-year estimated risk of atherosclerotic cardiovascular disease is 8.7%, which is >7.5%.  In addition to heart healthy diet and lifestyle, if patient is agreeable, I would recommend at least low-dose statin, such as Crestor 10 mg daily, and repeat lipid panel in 3 months.  Thanks MJP

## 2023-09-25 ENCOUNTER — Encounter: Payer: Self-pay | Admitting: Cardiology

## 2023-09-25 NOTE — Telephone Encounter (Signed)
 Pt called back. She said she will try to call back later

## 2023-09-25 NOTE — Telephone Encounter (Signed)
 Spoke with pt, aware of results and recommendations. She would like to consider this and she will let us  know if that is something she would like to do or not.

## 2023-09-25 NOTE — Progress Notes (Signed)
 Noted.  Thanks MJP

## 2023-09-25 NOTE — Telephone Encounter (Signed)
Pt calling back to speak to a nurse

## 2023-10-07 NOTE — Telephone Encounter (Signed)
 Please let Ms Belasco know about scheduling the CT scan. Supplements can reduce cholesterol by a small percentage. Even lowest dose statin has shown to reduce LDL by more points than supplements. I am okay if patient would like to repeat lipid panel in 3-6 months before starting statin.   Thanks MJP

## 2023-10-07 NOTE — Telephone Encounter (Signed)
 Please review

## 2023-10-08 ENCOUNTER — Other Ambulatory Visit: Payer: Self-pay | Admitting: *Deleted

## 2023-10-08 DIAGNOSIS — I77810 Thoracic aortic ectasia: Secondary | ICD-10-CM

## 2023-10-28 ENCOUNTER — Ambulatory Visit (HOSPITAL_COMMUNITY)
Admission: RE | Admit: 2023-10-28 | Discharge: 2023-10-28 | Disposition: A | Discharge status: Home / Self Care | Source: Ambulatory Visit | Attending: Cardiology | Admitting: Cardiology

## 2023-10-28 DIAGNOSIS — I77810 Thoracic aortic ectasia: Secondary | ICD-10-CM | POA: Insufficient documentation

## 2023-10-28 MED ORDER — IOHEXOL 350 MG/ML SOLN
75.0000 mL | Freq: Once | INTRAVENOUS | Status: AC | PRN
  Administered 2023-10-28: 75 mL via INTRAVENOUS

## 2023-10-29 ENCOUNTER — Ambulatory Visit: Payer: Self-pay | Admitting: Cardiology

## 2023-10-30 NOTE — Telephone Encounter (Signed)
 This is an incidental finding.  It was not highlighted on the previous CT scan, but it appears that it was present even before and is unchanged.  Given the small size and stability, it has no clinical significance and no further follow-up is recommended, as per Guidelines for Management of Incidental Pulmonary NodulesDetected on CT Images: From the Fleischner Society 2017; Radiology 2017; [phone].  Thanks MJP

## 2023-12-09 ENCOUNTER — Other Ambulatory Visit: Payer: Medicare Other

## 2024-01-17 ENCOUNTER — Ambulatory Visit: Payer: Self-pay

## 2024-01-21 ENCOUNTER — Ambulatory Visit (HOSPITAL_BASED_OUTPATIENT_CLINIC_OR_DEPARTMENT_OTHER)
Admission: RE | Admit: 2024-01-21 | Discharge: 2024-01-21 | Disposition: A | Discharge status: Home / Self Care | Source: Ambulatory Visit | Attending: Family Medicine | Admitting: Family Medicine

## 2024-01-21 DIAGNOSIS — Z13820 Encounter for screening for osteoporosis: Secondary | ICD-10-CM | POA: Diagnosis present

## 2024-01-21 DIAGNOSIS — Z780 Asymptomatic menopausal state: Secondary | ICD-10-CM | POA: Diagnosis not present

## 2024-03-09 ENCOUNTER — Other Ambulatory Visit: Payer: Self-pay

## 2024-03-09 ENCOUNTER — Ambulatory Visit
Admission: RE | Admit: 2024-03-09 | Discharge: 2024-03-09 | Disposition: A | Discharge status: Home / Self Care | Source: Ambulatory Visit | Attending: Family Medicine | Admitting: Family Medicine

## 2024-03-09 VITALS — BP 131/82 | HR 71 | Temp 98.6°F | Resp 17

## 2024-03-09 DIAGNOSIS — S40862A Insect bite (nonvenomous) of left upper arm, initial encounter: Secondary | ICD-10-CM

## 2024-03-09 DIAGNOSIS — W57XXXA Bitten or stung by nonvenomous insect and other nonvenomous arthropods, initial encounter: Secondary | ICD-10-CM | POA: Diagnosis not present

## 2024-03-09 MED ORDER — TRIAMCINOLONE ACETONIDE 0.1 % EX CREA: 1.0000 | TOPICAL_CREAM | Freq: Two times a day (BID) | CUTANEOUS | 0 refills | Status: AC

## 2024-03-09 MED ORDER — CETIRIZINE HCL 10 MG PO TABS: 10.0000 mg | ORAL_TABLET | Freq: Every day | ORAL | 0 refills | Status: AC

## 2024-03-09 NOTE — Discharge Instructions (Addendum)
 Start cetirizine  allergy medicine daily for at least 7 days.  You may use topical triamcinolone steroid cream to the bug bite areas twice daily as needed for itching/inflammation.  Please follow-up with your PCP if your symptoms do not improve.  Please go to the emergency room for any worsening symptoms.  Hope you feel better soon!

## 2024-03-09 NOTE — ED Provider Notes (Signed)
 [facility]    CSN: [id number] Arrival date & time: 03/09/24  1459      History   Chief Complaint Chief Complaint  Patient presents with   Insect Bite    HPI Jeanette Bentley is a 68 y.o. female presents for bug bites.  Patient was recently in the [place] and had [place] go through the [place] get to [place]ion.  She states she got several bug bites primarily on her left upper arm and back.  She states they are pruritic but denies any pain, swelling, drainage, fevers, chills, body aches.  She has been using topical Benadryl to the areas.  No other concerns at this time.  HPI  Past Medical History:  Diagnosis Date   Anemia    Hemoglobin C (Hb-C)    Hyperlipidemia     Patient Active Problem List   Diagnosis Date Noted   Acute pericarditis 06/04/2023   Pericardial effusion 06/03/2023   Chest pain 06/03/2023   Pleural effusion 06/03/2023   Anemia 06/03/2023   Neck pain 09/15/2012    Past Surgical History:  Procedure Laterality Date   CESAREAN SECTION     WISDOM TOOTH EXTRACTION      OB History   No obstetric history on file.      Home Medications    Prior to Admission medications   Medication Sig Start Date End Date Taking? Authorizing Provider  cetirizine  (ZYRTEC ) 10 MG tablet Take 1 tablet (10 mg total) by mouth daily. 03/09/24  Yes Anastasija Anfinson, Jodi R, NP  triamcinolone cream (KENALOG) 0.1 % Apply 1 Application topically 2 (two) times daily. 03/09/24  Yes Giada Schoppe, Jodi R, NP  Ascorbic Acid (VITAMIN C ER PO) Take by mouth.    [provider]  Astragalus Root 470 MG CAPS 2 capsules (5,000 MG total) Orally once a day    [provider]  Bioflavonoid Products (ESTER-C) TABS Take 2 tablets by mouth daily.    [provider]  carbamide peroxide (DEBROX) 6.5 % OTIC solution Place 5 drops into both ears daily as needed. 05/12/23   Christopher Savannah, PA-C  Cholecalciferol (VITAMIN D-3) 125 MCG (5000 UT) TABS Take 5,000 Units by  mouth daily.    [provider]  cyanocobalamin  1000 MCG tablet Take 1 tablet (1,000 mcg total) by mouth daily. 06/07/23   Vann, Jessica U, DO  Fluocinolone  Acetonide (DERMOTIC ) 0.01 % OIL Place 5 drops in ear(s) 2 (two) times daily as needed (Itching in ears). 11/28/22   Joesph Shaver Scales, PA-C  Krill Oil 1000 MG CAPS Take 2,000 mg by mouth daily.    [provider]  Misc Natural Products (BEET ROOT) 500 MG CAPS Take 1,000 mg by mouth daily.    [provider]  Oregano-Flaxseed Oil (OREGANO OIL-FLAXSEED OIL PO) Oregano Oil-Flaxseed Oil    [provider]  OVER THE COUNTER MEDICATION Take 2 capsules by mouth daily. Recovery    [provider]  Probiotic Product (PROBIOTIC DAILY) CAPS 65 billion CFU's Orally daily    [provider]  Red Yeast Rice 600 MG CAPS Take 4 capsules by mouth daily.    [provider]  saccharomyces boulardii (FLORASTOR) 250 MG capsule Take 250 mg by mouth daily.    [provider]  WHOLE GRAIN BARLEY,DIAGNOSTIC, IJ Take 1 Scoop by mouth daily.    [provider]    Family History Family History  Problem Relation Age of Onset   Arthritis Mother  Asthma Mother    Ulcers Mother    Heart disease Father    Hypertension Father    Kidney disease Sister    Hypertension Brother    Diabetes Sister    Anxiety disorder Daughter     Social History Social History   Tobacco Use   Smoking status: Never   Smokeless tobacco: Never  Vaping Use   Vaping status: Never Used  Substance Use Topics   Alcohol use: Yes    Comment: occ   Drug use: No     Allergies   Patient has no known allergies.   Review of Systems Review of Systems  Skin:        Bug bites     Physical Exam Triage Vital Signs ED Triage Vitals  Encounter Vitals Group     BP 03/09/24 1523 131/82     Girls Systolic BP Percentile --      Girls Diastolic BP Percentile --      Boys Systolic BP Percentile --       Boys Diastolic BP Percentile --      Pulse Rate 03/09/24 1523 71     Resp 03/09/24 1523 17     Temp 03/09/24 1523 98.6 F (37 C)     Temp Source 03/09/24 1523 Oral     SpO2 03/09/24 1523 96 %     Weight --      Height --      Head Circumference --      Peak Flow --      Pain Score 03/09/24 1520 0     Pain Loc --      Pain Education --      Exclude from Growth Chart --    No data found.  Updated Vital Signs BP 131/82   Pulse 71   Temp 98.6 F (37 C) (Oral)   Resp 17   SpO2 96%   Visual Acuity Right Eye Distance:   Left Eye Distance:   Bilateral Distance:    Right Eye Near:   Left Eye Near:    Bilateral Near:     Physical Exam Vitals and nursing note reviewed.  Constitutional:      General: She is not in acute distress.    Appearance: Normal appearance. She is not ill-appearing.  HENT:     Head: Normocephalic and atraumatic.  Eyes:     Pupils: Pupils are equal, round, and reactive to light.  Cardiovascular:     Rate and Rhythm: Normal rate.  Pulmonary:     Effort: Pulmonary effort is normal.  Skin:    General: Skin is warm and dry.     Comments: Scattered mildly erythematous insect bites to the left upper arm and left upper back.  There is no erythema, warmth, drainage or swelling or induration.  Neurological:     General: No focal deficit present.     Mental Status: She is alert and oriented to person, place, and time.  Psychiatric:        Mood and Affect: Mood normal.        Behavior: Behavior normal.      UC Treatments / Results  Labs (all labs ordered are listed, but only abnormal results are displayed) Labs Reviewed - No data to display  EKG   Radiology No results found.  Procedures Procedures (including critical care time)  Medications Ordered in UC Medications - No data to display  Initial Impression / Assessment and Plan / UC Course  I have reviewed the triage vital signs and the nursing notes.  Pertinent labs & imaging results  that were available during my care of the patient were reviewed by me and considered in my medical decision making (see chart for details).     Will do trial of cetirizine  and topical triamcinolone.  Advised follow-up with PCP if symptoms do not improve.  ER precautions reviewed. Final Clinical Impressions(s) / UC Diagnoses   Final diagnoses:  Insect bite of left upper arm, initial encounter     Discharge Instructions      Start cetirizine  allergy medicine daily for at least 7 days.  You may use topical triamcinolone steroid cream to the bug bite areas twice daily as needed for itching/inflammation.  Please follow-up with your PCP if your symptoms do not improve.  Please go to the emergency room for any worsening symptoms.  Hope you feel better soon!    ED Prescriptions     Medication Sig Dispense Auth. Provider   cetirizine  (ZYRTEC ) 10 MG tablet Take 1 tablet (10 mg total) by mouth daily. 14 tablet Haygen Zebrowski, Jodi R, NP   triamcinolone cream (KENALOG) 0.1 % Apply 1 Application topically 2 (two) times daily. 45 g Adaya Garmany, Jodi R, NP      PDMP not reviewed this encounter.   Loreda Myla SAUNDERS, NP 03/09/24 1534

## 2024-03-09 NOTE — ED Triage Notes (Addendum)
 Pt c/o big, red raised bumps on left shoulder, left arm, and left side of upper back and one red bump on right leg. Pt states was recently in the [place] and [place] the [place]
